# Patient Record
Sex: Male | Born: 1955 | Race: White | Hispanic: No | Marital: Married | Smoking: Former smoker
Health system: Southern US, Community
[De-identification: ages and names within clinical notes are randomized; demographics above are authoritative.]

## PROBLEM LIST (undated history)

## (undated) DIAGNOSIS — I10 Essential (primary) hypertension: Secondary | ICD-10-CM

## (undated) HISTORY — PX: UMBILICAL HERNIA REPAIR: SHX196

## (undated) HISTORY — PX: INGUINAL HERNIA REPAIR: SUR1180

---

## 2016-11-21 ENCOUNTER — Inpatient Hospital Stay (HOSPITAL_COMMUNITY)
Admission: EM | Admit: 2016-11-21 | Discharge: 2016-11-27 | DRG: 299 | Disposition: A | Discharge status: Rehabilitation Facility | Payer: BLUE CROSS/BLUE SHIELD | Attending: Cardiothoracic Surgery | Admitting: Cardiothoracic Surgery

## 2016-11-21 ENCOUNTER — Encounter (HOSPITAL_COMMUNITY): Payer: Self-pay | Admitting: Emergency Medicine

## 2016-11-21 ENCOUNTER — Inpatient Hospital Stay (HOSPITAL_COMMUNITY): Payer: BLUE CROSS/BLUE SHIELD

## 2016-11-21 ENCOUNTER — Emergency Department (HOSPITAL_COMMUNITY): Payer: BLUE CROSS/BLUE SHIELD

## 2016-11-21 DIAGNOSIS — E46 Unspecified protein-calorie malnutrition: Secondary | ICD-10-CM | POA: Diagnosis not present

## 2016-11-21 DIAGNOSIS — G8191 Hemiplegia, unspecified affecting right dominant side: Secondary | ICD-10-CM | POA: Diagnosis present

## 2016-11-21 DIAGNOSIS — I7100 Dissection of unspecified site of aorta: Secondary | ICD-10-CM | POA: Diagnosis not present

## 2016-11-21 DIAGNOSIS — I6330 Cerebral infarction due to thrombosis of unspecified cerebral artery: Secondary | ICD-10-CM | POA: Diagnosis not present

## 2016-11-21 DIAGNOSIS — Z9114 Patient's other noncompliance with medication regimen: Secondary | ICD-10-CM | POA: Diagnosis not present

## 2016-11-21 DIAGNOSIS — Z9119 Patient's noncompliance with other medical treatment and regimen: Secondary | ICD-10-CM | POA: Diagnosis not present

## 2016-11-21 DIAGNOSIS — I7101 Dissection of thoracic aorta: Secondary | ICD-10-CM | POA: Diagnosis present

## 2016-11-21 DIAGNOSIS — I6340 Cerebral infarction due to embolism of unspecified cerebral artery: Secondary | ICD-10-CM | POA: Diagnosis not present

## 2016-11-21 DIAGNOSIS — I7789 Other specified disorders of arteries and arterioles: Secondary | ICD-10-CM | POA: Diagnosis not present

## 2016-11-21 DIAGNOSIS — E785 Hyperlipidemia, unspecified: Secondary | ICD-10-CM | POA: Diagnosis present

## 2016-11-21 DIAGNOSIS — I6339 Cerebral infarction due to thrombosis of other cerebral artery: Secondary | ICD-10-CM | POA: Diagnosis not present

## 2016-11-21 DIAGNOSIS — R4702 Dysphasia: Secondary | ICD-10-CM

## 2016-11-21 DIAGNOSIS — R4781 Slurred speech: Secondary | ICD-10-CM | POA: Diagnosis present

## 2016-11-21 DIAGNOSIS — N179 Acute kidney failure, unspecified: Secondary | ICD-10-CM | POA: Diagnosis not present

## 2016-11-21 DIAGNOSIS — K219 Gastro-esophageal reflux disease without esophagitis: Secondary | ICD-10-CM | POA: Diagnosis present

## 2016-11-21 DIAGNOSIS — R29810 Facial weakness: Secondary | ICD-10-CM | POA: Diagnosis present

## 2016-11-21 DIAGNOSIS — R1310 Dysphagia, unspecified: Secondary | ICD-10-CM | POA: Diagnosis present

## 2016-11-21 DIAGNOSIS — I639 Cerebral infarction, unspecified: Secondary | ICD-10-CM | POA: Diagnosis not present

## 2016-11-21 DIAGNOSIS — I6350 Cerebral infarction due to unspecified occlusion or stenosis of unspecified cerebral artery: Secondary | ICD-10-CM | POA: Diagnosis not present

## 2016-11-21 DIAGNOSIS — Z87891 Personal history of nicotine dependence: Secondary | ICD-10-CM | POA: Diagnosis not present

## 2016-11-21 DIAGNOSIS — K229 Disease of esophagus, unspecified: Secondary | ICD-10-CM

## 2016-11-21 DIAGNOSIS — R7303 Prediabetes: Secondary | ICD-10-CM | POA: Diagnosis not present

## 2016-11-21 DIAGNOSIS — E236 Other disorders of pituitary gland: Secondary | ICD-10-CM | POA: Diagnosis present

## 2016-11-21 DIAGNOSIS — I161 Hypertensive emergency: Secondary | ICD-10-CM

## 2016-11-21 DIAGNOSIS — R079 Chest pain, unspecified: Secondary | ICD-10-CM

## 2016-11-21 DIAGNOSIS — I10 Essential (primary) hypertension: Secondary | ICD-10-CM

## 2016-11-21 DIAGNOSIS — T148XXA Other injury of unspecified body region, initial encounter: Secondary | ICD-10-CM | POA: Diagnosis not present

## 2016-11-21 DIAGNOSIS — I719 Aortic aneurysm of unspecified site, without rupture: Secondary | ICD-10-CM

## 2016-11-21 DIAGNOSIS — R1013 Epigastric pain: Secondary | ICD-10-CM | POA: Diagnosis not present

## 2016-11-21 HISTORY — DX: Essential (primary) hypertension: I10

## 2016-11-21 LAB — COMPREHENSIVE METABOLIC PANEL
ALBUMIN: 4.4 g/dL (ref 3.5–5.0)
ALT: 17 U/L (ref 17–63)
AST: 20 U/L (ref 15–41)
Alkaline Phosphatase: 70 U/L (ref 38–126)
Anion gap: 9 (ref 5–15)
BILIRUBIN TOTAL: 1 mg/dL (ref 0.3–1.2)
BUN: 10 mg/dL (ref 6–20)
CHLORIDE: 104 mmol/L (ref 101–111)
CO2: 23 mmol/L (ref 22–32)
CREATININE: 1.27 mg/dL — AB (ref 0.61–1.24)
Calcium: 8.7 mg/dL — ABNORMAL LOW (ref 8.9–10.3)
GFR calc Af Amer: >60 mL/min (ref >60)
GFR, EST NON AFRICAN AMERICAN: 60 mL/min — AB (ref >60)
GLUCOSE: 158 mg/dL — AB (ref 65–99)
POTASSIUM: 3.7 mmol/L (ref 3.5–5.1)
Sodium: 136 mmol/L (ref 135–145)
Total Protein: 7.2 g/dL (ref 6.5–8.1)

## 2016-11-21 LAB — CBC
HEMATOCRIT: 47 % (ref 39.0–52.0)
HEMOGLOBIN: 16.3 g/dL (ref 13.0–17.0)
MCH: 30 pg (ref 26.0–34.0)
MCHC: 34.7 g/dL (ref 30.0–36.0)
MCV: 86.4 fL (ref 78.0–100.0)
Platelets: 183 10*3/uL (ref 150–400)
RBC: 5.44 MIL/uL (ref 4.22–5.81)
RDW: 13 % (ref 11.5–15.5)
WBC: 9.8 10*3/uL (ref 4.0–10.5)

## 2016-11-21 LAB — URINALYSIS, COMPLETE (UACMP) WITH MICROSCOPIC
Bacteria, UA: NONE SEEN
Bilirubin Urine: NEGATIVE
Glucose, UA: NEGATIVE mg/dL
Ketones, ur: 20 mg/dL — AB
Leukocytes, UA: NEGATIVE
Nitrite: NEGATIVE
Protein, ur: NEGATIVE mg/dL
Specific Gravity, Urine: >1.046 — ABNORMAL HIGH (ref 1.005–1.030)
pH: 5 (ref 5.0–8.0)

## 2016-11-21 LAB — TROPONIN I
Troponin I: <0.03 ng/mL (ref <0.03)
Troponin I: 0.14 ng/mL (ref <0.03)
Troponin I: 0.45 ng/mL (ref <0.03)

## 2016-11-21 LAB — PROTIME-INR
INR: 0.99
Prothrombin Time: 13.1 seconds (ref 11.4–15.2)

## 2016-11-21 LAB — I-STAT TROPONIN, ED: Troponin i, poc: 0 ng/mL (ref 0.00–0.08)

## 2016-11-21 LAB — D-DIMER, QUANTITATIVE: D-Dimer, Quant: 2.06 ug/mL-FEU — ABNORMAL HIGH (ref 0.00–0.50)

## 2016-11-21 LAB — MAGNESIUM: Magnesium: 1.8 mg/dL (ref 1.7–2.4)

## 2016-11-21 LAB — MRSA PCR SCREENING: MRSA by PCR: NEGATIVE

## 2016-11-21 MED ORDER — LABETALOL HCL 5 MG/ML IV SOLN: 0.5000 mg/min | INTRAVENOUS | Status: DC

## 2016-11-21 MED ORDER — NICARDIPINE HCL IN NACL 20-0.86 MG/200ML-% IV SOLN
3.0000 mg/h | INTRAVENOUS | Status: DC
  Administered 2016-11-21: 4 mg/h via INTRAVENOUS
  Administered 2016-11-21: 5 mg/h via INTRAVENOUS
  Filled 2016-11-21: qty 200

## 2016-11-21 MED ORDER — PANTOPRAZOLE SODIUM 40 MG PO TBEC
40.0000 mg | DELAYED_RELEASE_TABLET | Freq: Every day | ORAL | Status: DC
  Administered 2016-11-21 – 2016-11-25 (×5): 40 mg via ORAL
  Filled 2016-11-21 (×5): qty 1

## 2016-11-21 MED ORDER — FENTANYL CITRATE (PF) 100 MCG/2ML IJ SOLN: 50.0000 ug | INTRAMUSCULAR | Status: DC | PRN

## 2016-11-21 MED ORDER — MORPHINE SULFATE (PF) 4 MG/ML IV SOLN
4.0000 mg | Freq: Once | INTRAVENOUS | Status: AC
  Administered 2016-11-21: 4 mg via INTRAVENOUS
  Filled 2016-11-21: qty 1

## 2016-11-21 MED ORDER — METOPROLOL TARTRATE 5 MG/5ML IV SOLN
5.0000 mg | Freq: Once | INTRAVENOUS | Status: AC
  Administered 2016-11-21: 5 mg via INTRAVENOUS
  Filled 2016-11-21: qty 5

## 2016-11-21 MED ORDER — METOPROLOL TARTRATE 25 MG PO TABS
25.0000 mg | ORAL_TABLET | Freq: Two times a day (BID) | ORAL | Status: DC
  Administered 2016-11-22 – 2016-11-23 (×4): 25 mg via ORAL
  Filled 2016-11-21 (×4): qty 1

## 2016-11-21 MED ORDER — DEXTROSE-NACL 5-0.45 % IV SOLN
INTRAVENOUS | Status: DC
  Administered 2016-11-21: 14:00:00 via INTRAVENOUS

## 2016-11-21 MED ORDER — NICARDIPINE HCL IN NACL 20-0.86 MG/200ML-% IV SOLN
3.0000 mg/h | Freq: Once | INTRAVENOUS | Status: AC
  Administered 2016-11-21: 5 mg/h via INTRAVENOUS
  Filled 2016-11-21: qty 200

## 2016-11-21 MED ORDER — ACETAMINOPHEN 650 MG RE SUPP: 650.0000 mg | Freq: Four times a day (QID) | RECTAL | Status: DC | PRN

## 2016-11-21 MED ORDER — IOPAMIDOL (ISOVUE-370) INJECTION 76%
INTRAVENOUS | Status: AC
  Administered 2016-11-21: 100 mL
  Filled 2016-11-21: qty 100

## 2016-11-21 MED ORDER — MORPHINE SULFATE (PF) 4 MG/ML IV SOLN
8.0000 mg | Freq: Once | INTRAVENOUS | Status: AC
  Administered 2016-11-21: 8 mg via INTRAVENOUS
  Filled 2016-11-21: qty 2

## 2016-11-21 MED ORDER — MAGNESIUM HYDROXIDE 400 MG/5ML PO SUSP: 30.0000 mL | Freq: Every day | ORAL | Status: DC | PRN

## 2016-11-21 MED ORDER — GI COCKTAIL ~~LOC~~
30.0000 mL | Freq: Once | ORAL | Status: DC
  Filled 2016-11-21 (×2): qty 30

## 2016-11-21 MED ORDER — TRAMADOL HCL 50 MG PO TABS: 50.0000 mg | ORAL_TABLET | Freq: Four times a day (QID) | ORAL | Status: DC | PRN

## 2016-11-21 MED ORDER — LOSARTAN POTASSIUM 50 MG PO TABS
50.0000 mg | ORAL_TABLET | Freq: Every day | ORAL | Status: DC
  Administered 2016-11-22: 50 mg via ORAL
  Filled 2016-11-21: qty 1

## 2016-11-21 MED ORDER — FAMOTIDINE IN NACL 20-0.9 MG/50ML-% IV SOLN
20.0000 mg | Freq: Two times a day (BID) | INTRAVENOUS | Status: DC
  Administered 2016-11-21 – 2016-11-24 (×8): 20 mg via INTRAVENOUS
  Filled 2016-11-21 (×9): qty 50

## 2016-11-21 MED ORDER — ONDANSETRON HCL 4 MG/2ML IJ SOLN
4.0000 mg | Freq: Once | INTRAMUSCULAR | Status: AC
  Administered 2016-11-21: 4 mg via INTRAVENOUS
  Filled 2016-11-21: qty 2

## 2016-11-21 MED ORDER — ACETAMINOPHEN 325 MG PO TABS
650.0000 mg | ORAL_TABLET | Freq: Four times a day (QID) | ORAL | Status: DC | PRN
  Administered 2016-11-23: 650 mg via ORAL
  Filled 2016-11-21: qty 2

## 2016-11-21 MED ORDER — SORBITOL 70 % SOLN: 30.0000 mL | Freq: Every day | Status: DC | PRN

## 2016-11-21 MED ORDER — LABETALOL HCL 5 MG/ML IV SOLN
0.5000 mg/min | INTRAVENOUS | Status: DC
  Administered 2016-11-21: 0.5 mg/min via INTRAVENOUS
  Administered 2016-11-22: 1 mg/min via INTRAVENOUS
  Administered 2016-11-22: 2 mg/min via INTRAVENOUS
  Filled 2016-11-21 (×5): qty 100

## 2016-11-21 MED ORDER — ONDANSETRON HCL 4 MG PO TABS: 4.0000 mg | ORAL_TABLET | Freq: Four times a day (QID) | ORAL | Status: DC | PRN

## 2016-11-21 MED ORDER — ONDANSETRON HCL 4 MG/2ML IJ SOLN: 4.0000 mg | Freq: Four times a day (QID) | INTRAMUSCULAR | Status: DC | PRN

## 2016-11-21 MED ORDER — METOPROLOL TARTRATE 25 MG PO TABS: 25.0000 mg | ORAL_TABLET | Freq: Two times a day (BID) | ORAL | Status: DC

## 2016-11-21 MED ORDER — ADULT MULTIVITAMIN W/MINERALS CH
1.0000 | ORAL_TABLET | Freq: Every day | ORAL | Status: DC
  Administered 2016-11-21 – 2016-11-27 (×7): 1 via ORAL
  Filled 2016-11-21 (×8): qty 1

## 2016-11-21 MED ORDER — SIMETHICONE 80 MG PO CHEW
80.0000 mg | CHEWABLE_TABLET | Freq: Four times a day (QID) | ORAL | Status: DC
  Administered 2016-11-21 – 2016-11-27 (×23): 80 mg via ORAL
  Filled 2016-11-21 (×23): qty 1

## 2016-11-21 MED ORDER — HYDRALAZINE HCL 20 MG/ML IJ SOLN
20.0000 mg | INTRAMUSCULAR | Status: AC
  Administered 2016-11-21: 20 mg via INTRAVENOUS
  Filled 2016-11-21: qty 1

## 2016-11-21 NOTE — Progress Notes (Signed)
2305 pt called said experiencing rt arm weakness stoke assessment done slight rt facial droop noted along with rt arm drift rapid response paged 2316 rapid response arrived 2343 stat head ct finished neurologist consulted

## 2016-11-21 NOTE — ED Notes (Signed)
CT surgery at bedside.

## 2016-11-21 NOTE — ED Notes (Signed)
Paged admitting about BP medication clarification.

## 2016-11-21 NOTE — Progress Notes (Signed)
CT surgery p.m. 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

## 2016-11-21 NOTE — ED Provider Notes (Signed)
[facility]-EMERGENCY DEPT Provider Note   CSN: [id number] Arrival date & time: 11/21/16  4098     History   Chief Complaint No chief complaint on file.   HPI Jordan Mcintosh is a 61 y.o. male.  HPI Patient presents with concern of back pain, upper abdominal pain. Patient awoke this morning with pain in his mid back, which then began to radiate to the upper mid abdomen. Since onset symptoms of persistent, though improved after paramedics arrived, and he took aspirin, nitroglycerin. He also complains of general discomfort, mild lightheadedness, but no syncope, no dyspnea, no weakness in any extremity, no nausea, vomiting. EMS notes that on arrival the patient was diaphoretic, weak appearing, with a blood pressure of 280 systolic. En route, the patient had subjective improvement, and blood pressure decreased to 260 systolic. Patient notes that he stopped taking blood pressure medication about 9 months ago, prior to that the patient had blood pressure readings typically in the 200 systolic range.   No past medical history on file.  There are no active problems to display for this patient.   No past surgical history on file.     Home Medications    Prior to Admission medications   Not on File    Family History No family history on file.  Social History Social History  Substance Use Topics  . Smoking status: Not on file  . Smokeless tobacco: Not on file  . Alcohol use Not on file     Allergies   Patient has no allergy information on record.   Review of Systems Review of Systems  Constitutional:       Per HPI, otherwise negative  HENT:       Per HPI, otherwise negative  Respiratory:       Per HPI, otherwise negative  Cardiovascular:       Per HPI, otherwise negative  Gastrointestinal: Negative for vomiting.  Endocrine:       Negative aside from HPI  Genitourinary:       Neg aside from HPI   Musculoskeletal:       Per HPI, otherwise negative  Skin: Negative.    Neurological: Negative for syncope.     Physical Exam Updated Vital Signs SpO2 98%   Physical Exam  Constitutional: He is oriented to person, place, and time. He appears well-developed.  Diaphoretic male, awake, alert ariving via EMS  HENT:  Head: Normocephalic and atraumatic.  Eyes: Conjunctivae and EOM are normal.  Cardiovascular: Normal rate and regular rhythm.   Pulmonary/Chest: Effort normal. No stridor. No respiratory distress.  Abdominal: He exhibits no distension.  TTP just superior to the umbilicus, w no guarding, rebound.  Musculoskeletal: He exhibits no edema.  Neurological: He is alert and oriented to person, place, and time. He displays no atrophy and no tremor. No cranial nerve deficit. He exhibits normal muscle tone. He displays no seizure activity.  Skin: Skin is warm and dry.  Psychiatric: He has a normal mood and affect.  Nursing note and vitals reviewed.  Initial blood pressure systolic 260+, not registering on the monitor. Map 180. Patient will receive hydralazine, as he is receiving initial therapy. Patient also complains of severe pain, will receive morphine. Cardiac monitors applied, labs drawn.   9:43 AM BP following hydralazine 220/115  ED Treatments / Results  Labs (all labs ordered are listed, but only abnormal results are displayed) Labs Reviewed  CBC  D-DIMER, QUANTITATIVE (NOT AT [facility])  COMPREHENSIVE METABOLIC PANEL  TROPONIN I  Rosezena Sensor, ED   EMS rhythm strip, rate 94, T-wave abnormalities, some hypertrophic changes, abnormal  EKG  EKG Interpretation  Date/Time:  Sunday November 21 2016 09:25:15 EDT Ventricular Rate:  92 PR Interval:    QRS Duration: 86 QT Interval:  387 QTC Calculation: 479 R Axis:   45 Text Interpretation:  Sinus rhythm Artifact T wave abnormality Non-specific intra-ventricular conduction delay Abnormal ekg Confirmed by Gerhard Munch  MD [phone]) on 11/21/2016 9:28:59 AM         Radiology Dg Chest  Port 1 View  Result Date: 11/21/2016 CLINICAL DATA:  Patient from home via [facility]. Patient reports back pain that started this morning and radiates to his epigastrium. Per [facility] patient blood pressure 260/140 on arrival and patient was diaphoretic Patient reports not taking blood pressure medications in nine months. Hx of hypertension EXAM: PORTABLE CHEST 1 VIEW COMPARISON:  None. FINDINGS: Cardiac silhouette is normal in size. No mediastinal or hilar masses. No evidence of adenopathy. Clear lungs.  No pleural effusion or pneumothorax. Skeletal structures are intact. IMPRESSION: No active disease. Electronically Signed   By: Amie Portland M.D.   On: 11/21/2016 09:44   Ct Angio Chest/abd/pel For Dissection W And/or Wo Contrast  Result Date: 11/21/2016 CLINICAL DATA:  Patient reports back pain that started this morning and radiates to his epigastrium. Per [facility] patient blood pressure 260/140 on arrival and patient was diaphoretic Patient reports not taking blood pressure medications in nine months. Wife gave aspirinHx of hernia repairNo hx of CA EXAM: CT ANGIOGRAPHY CHEST, ABDOMEN AND PELVIS TECHNIQUE: Multidetector CT imaging through the chest, abdomen and pelvis was performed using the standard protocol during bolus administration of intravenous contrast. Multiplanar reconstructed images and MIPs were obtained and reviewed to evaluate the vascular anatomy. CONTRAST:  100 mL of Isovue 370 intravenous contrast COMPARISON:  Current chest radiograph FINDINGS: CTA CHEST FINDINGS Cardiovascular: The thoracic aorta is normal in caliber. At the junction of the distal arch and descending thoracic aorta, there is a penetrating ulcer associated with an intramural hematoma. The hematoma extends from the distal aortic arch through the descending thoracic aorta to the level of the aortic hiatus. There is no dissection. There is mild atherosclerosis at the origin of the arch branch vessels with no  significant stenosis. The heart is normal in size. There are moderate coronary artery calcifications. Pulmonary arteries are normal in caliber. Mediastinum/Nodes: No neck base, axillary, mediastinal or hilar masses or adenopathy. Trachea is widely patent. There is a small sliding hiatal hernia. Esophagus is unremarkable. Lungs/Pleura: Minor dependent subsegmental atelectasis. No pneumonia or pulmonary edema. No mass or suspicious nodule. No pleural effusion or pneumothorax. Musculoskeletal: No fracture or acute finding. No osteoblastic or osteolytic lesions. Review of the MIP images confirms the above findings. CTA ABDOMEN AND PELVIS FINDINGS VASCULAR Aorta: Aorta is normal in caliber. Atherosclerotic plaque is noted throughout the aorta with no significant stenosis. No dissection. Celiac: Atherosclerotic plaque at the origin of the celiac artery causes a 70% narrowing. SMA: Eccentric plaque at the origin of the superior mesenteric artery causes a 50% narrowing. Renals: Mild plaque at the origin of single renal arteries bilaterally is noted without significant stenosis. IMA: Plaque at the origin without significant stenosis. Inflow: Mild atherosclerosis of the common iliac arteries without stenosis. Atherosclerotic change involving both common femoral arteries without significant stenosis. Veins: No obvious venous abnormality within the limitations of this arterial phase study. Review of the MIP images confirms the above findings. NON-VASCULAR Hepatobiliary:  The liver calcifications consistent healed granuloma. Liver otherwise unremarkable. Normal gallbladder. No bile duct dilation. Pancreas: Unremarkable. No pancreatic ductal dilatation or surrounding inflammatory changes. Spleen: Splenic calcifications consistent healed granuloma. Spleen otherwise unremarkable. Adrenals/Urinary Tract: Low-density, 14 mm right adrenal mass, consistent with an adenoma. Normal left adrenal gland. Small low-density lesions in the  right kidney, likely cysts. Small nonobstructing stone in the lower pole the right kidney. No left renal masses or cysts. Mild bilateral renal cortical thinning. No hydronephrosis. Normal ureters. Bladder is unremarkable. Stomach/Bowel: Stomach unremarkable other than the small hiatal hernia. Normal small bowel. No colonic dilation, wall thickening or inflammation. Appendix not visualized. Lymphatic: No enlarged lymph nodes. Reproductive: Unremarkable. Other: No abdominal wall hernia or abnormality. No abdominopelvic ascites. 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 No fracture or acute finding. No osteoblastic or osteolytic lesions. Review of the MIP images confirms the above findings. IMPRESSION: 1. There is a penetrating ulcer at the junction of the aortic arch and descending thoracic aorta associated with an intramural hematoma, with the hematoma extending from the distal arch to just above the aortic hiatus. This is presumably the source of this patient's pain. 2. No aortic dissection. No aneurysm. No other acute abnormality within the chest, abdomen or pelvis. 3. Aortic and branch vessel atherosclerosis. 70% narrowing at the origin of the celiac artery. 50% narrowing at the origin of the superior mesenteric artery. 4. Small hiatal hernia. 5. Low-density right adrenal mass most likely an adenoma. 6. Changes of healed granulomatous disease. 7. Chronic avascular necrosis of both femoral heads. Electronically Signed   By: Amie Portland M.D.   On: 11/21/2016 11:27    Procedures Procedures (including critical care time)  Medications Ordered in ED Medications  hydrALAZINE (APRESOLINE) injection 20 mg (not administered)   11:53 AM Patient is now on a Cardene drip. His blood pressure has diminished appropriate, and is now 155/74.  Pain has resolved. I have reviewed the bradycardia  Images myself, agree with the interpretation. Subsequently I discussed with our cardiothoracic  surgery team given concern for ulceration, intramural hematoma. Given the patient's hypertensive emergency, new aortic intramural hematoma, he will require admission for further evaluation and management.   Initial Impression / Assessment and Plan / ED Course  I have reviewed the triage vital signs and the nursing notes.  Pertinent labs & imaging results that were available during my care of the patient were reviewed by me and considered in my medical decision making (see chart for details).    Final Clinical Impressions(s) / ED Diagnoses   Final diagnoses:  Ulcer of aorta [facility])  Hypertensive emergency   CRITICAL CARE Performed by: Gerhard Munch Total critical care time: 40 minutes Critical care time was exclusive of separately billable procedures and treating other patients. Critical care was necessary to treat or prevent imminent or life-threatening deterioration. Critical care was time spent personally by me on the following activities: development of treatment plan with patient and/or surrogate as well as nursing, discussions with consultants, evaluation of patient's response to treatment, examination of patient, obtaining history from patient or surrogate, ordering and performing treatments and interventions, ordering and review of laboratory studies, ordering and review of radiographic studies, pulse oximetry and re-evaluation of patient's condition.    Gerhard Munch, MD 11/21/16 1155

## 2016-11-21 NOTE — Progress Notes (Signed)
Troponin 0.14.  Dr. Donata Clay notified. [place], Mitzi Hansen

## 2016-11-21 NOTE — Consult Note (Signed)
[address]       [place] [address]             [phone]        [facility] Medical Record #[id number] Date of Birth: 1956-05-07  Referring:Dr Jeraldine Loots Primary Care: Feliciana Rossetti, MD  Chief Complaint:    Chief Complaint  Patient presents with  . Abdominal Pain  . 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, no dissection  61 yo WM with htn stopped taking his meds months ago Presented to ED with 4 hrs of epigastric pain, mid back pain, sweats, nausea  No focal weakness BP was 280 mm hg CTA shows normal size of thoracic aorta [3.5 cm] with small penetating ulcer- contained leak with peri-aortic hematoma Placed on cardene drip and given MSO4 with resolution of symptoms Will be admitted to ICU for BP, pain control and observation   Past surgical hx- groin hernia repair, umbilical hernia repair Stopped smoking > 10 yrs ago  Current Activity/ Functional Status: Lives with wife, works in Dentist   Zubrod Score: At the time of surgery this patient's most appropriate activity status/level should be described as:     0    Normal activity, no symptoms     1    Restricted in physical strenuous activity but ambulatory, able to do out light work     2    Ambulatory and capable of self care, unable to do work activities, up and about                 more than 50%  Of the time                                3    Only limited self care, in bed greater than 50% of waking hours     4    Completely disabled, no self care, confined to bed or chair     5    Moribund  Past Medical History:  Diagnosis Date  . Hypertension     History reviewed. No pertinent surgical history.  History  Smoking Status  . Former Smoker  Smokeless Tobacco  . Never Used    Comment: Smoked marijauna in the past but quit 9 years ago     History  Alcohol Use No    Social History   Social History  . Marital status: Married    Spouse name: N/A  . Number of children: N/A  . Years of education: N/A   Occupational History  . Not on file.   Social History Main Topics  . Smoking status: Former Games developer  . Smokeless tobacco: Never Used     Comment: Smoked marijauna in the past but quit 9 years ago    . Alcohol use No  . Drug use: Yes    Types: Marijuana     Comment: Smoked marijauna in the past but quit 9 years ago  . Sexual activity: Yes   Other Topics Concern  . Not on file   Social History Narrative  . No narrative on file    Not on File  Current Facility-Administered Medications  Medication Dose Route Frequency Provider Last Rate Last Dose  . acetaminophen (  TYLENOL) tablet 650 mg  650 mg Oral Q6H PRN Kerin Perna, MD       Or  . acetaminophen (TYLENOL) suppository 650 mg  650 mg Rectal Q6H PRN Kerin Perna, MD      . dextrose 5 %-0.45 % sodium chloride infusion   Intravenous Continuous Kerin Perna, MD      . famotidine (PEPCID) IVPB 20 mg premix  20 mg Intravenous Q12H Kerin Perna, MD      . fentaNYL (SUBLIMAZE) injection 50 mcg  50 mcg Intravenous Q1H PRN Kerin Perna, MD      . gi cocktail (Maalox,Lidocaine,Donnatal)  30 mL Oral Once Gerhard Munch, MD   Stopped at 11/21/16 1200  . labetalol (NORMODYNE,TRANDATE) 500 mg in dextrose 5 % 125 mL (4 mg/mL) infusion  0.5-3 mg/min Intravenous Titrated Kerin Perna, MD      . Melene Muller ON 11/22/2016] losartan (COZAAR) tablet 50 mg  50 mg Oral Daily Kerin Perna, MD      . magnesium hydroxide (MILK OF MAGNESIA) suspension 30 mL  30 mL Oral Daily PRN Kerin Perna, MD      . Melene Muller ON 11/22/2016] metoprolol tartrate (LOPRESSOR) tablet 25 mg  25 mg Oral BID Kerin Perna, MD      . multivitamin with minerals tablet 1 tablet  1 tablet Oral Daily Kerin Perna, MD      .  nicardipine (CARDENE)  in 0.86% saline IV infusion (0.1 mg/ml)  3-15 mg/hr Intravenous Continuous Kerin Perna, MD 50 mL/hr at 11/21/16 1343 5 mg/hr at 11/21/16 1343  . ondansetron (ZOFRAN) tablet 4 mg  4 mg Oral Q6H PRN Kerin Perna, MD       Or  . ondansetron [facility]) injection 4 mg  4 mg Intravenous Q6H PRN Kerin Perna, MD      . pantoprazole (PROTONIX) EC tablet 40 mg  40 mg Oral Q1200 Kerin Perna, MD      . sorbitol 70 % solution 30 mL  30 mL Oral Daily PRN Kerin Perna, MD      . traMADol Janean Sark) tablet 50 mg  50 mg Oral Q6H PRN Kerin Perna, MD       No current outpatient prescriptions on file.     (Not in a hospital admission)  History reviewed. No pertinent family history.   Review of Systems:       Cardiac Review of Systems: Y or N  Chest Pain [  y  ]  Resting SOB [ n  ] Exertional SOB  Cove.Etienne  ]  Orthopnea [ n ]   Pedal Edema [n   ]    Palpitations [ y ] Syncope  [n  ]   Presyncope Cove.Etienne   ]  General Review of Systems: [Y] = yes [  ]=no Constitional: recent weight change [  ]; anorexia [  ]; fatigue [  ]; nausea [  ]; night sweats [  ]; fever [  ]; or chills [  ]                                                               Dental: poor dentition[  ]; Last Dentist visit:edentulous   Eye : blurred  vision [  ]; diplopia [   ]; vision changes [  ];  Amaurosis fugax[  ]; Resp: cough [  ];  wheezing[  ];  hemoptysis[  ]; shortness of breath[  ]; paroxysmal nocturnal dyspnea[  ]; dyspnea on exertion[  ]; or orthopnea[  ];  GI:  gallstones[  ], vomiting[y  ];  dysphagia[ y ]; melena[  ];  hematochezia [  ]; heartburn[ y ];   Hx of  Colonoscopy[  ]; GU: kidney stones [  y]; hematuria[  ];   dysuria [  ];  nocturia[  ];  history of     obstruction [  ]; urinary frequency [  ]             Skin: rash, swelling[  ];, hair loss[  ];  peripheral edema[  ];  or itching[  ]; Musculosketetal: myalgias[  ];  joint swelling[  ];  joint erythema[  ];  joint pain[  ];   back pain[ y ];  Heme/Lymph: bruising[  ];  bleeding[  ];  anemia[  ];  Neuro: TIA[  ];  headaches[  ];  stroke[  ];  vertigo[  ];  seizures[  ];   paresthesias[  ];  difficulty walking[  ];  Psych:depression[  ]; anxiety[  ];  Endocrine: diabetes[  ];  thyroid dysfunction[  ];  Immunizations: Flu [  ]; Pneumococcal[  ];  Other: R hand dominant  Physical Exam: BP (!) 160/84   Pulse 94   Temp 97.6 F (36.4 C) (Oral)   Resp 19   Ht  (1.803 m)   Wt 200 lb (90.7 kg)   SpO2 94%   BMI 27.89 kg/m       Physical Exam  General: Well-nourished middle-aged Caucasian male anxious but in no acute distress HEENT: Normocephalic pupils equal , dentition adequate Neck: Supple without JVD, adenopathy, or bruit Chest: Clear to auscultation, symmetrical breath sounds, no rhonchi, no tenderness             or deformity Cardiovascular: Regular rate and rhythm, sinus tachycardia, no murmur, no gallop, peripheral pulses             palpable in all extremities Abdomen:  Soft, nontender, no palpable mass or organomegaly Extremities: Warm, well-perfused, no clubbing cyanosis edema or tenderness,              no venous stasis changes of the legs Rectal/GU: Deferred Neuro: Grossly non--focal and symmetrical throughout Skin: Clean and dry without rash or ulceration    Diagnostic Studies & Laboratory data:     Recent Radiology Findings:   Dg Chest Port 1 View  Result Date: 11/21/2016 CLINICAL DATA:  Patient from home via [facility]. Patient reports back pain that started this morning and radiates to his epigastrium. Per [facility] patient blood pressure 260/140 on arrival and patient was diaphoretic Patient reports not taking blood pressure medications in nine months. Hx of hypertension EXAM: PORTABLE CHEST 1 VIEW COMPARISON:  None. FINDINGS: Cardiac silhouette is normal in size. No mediastinal or hilar masses. No evidence of adenopathy. Clear lungs.  No pleural effusion or pneumothorax.  Skeletal structures are intact. IMPRESSION: No active disease. Electronically Signed   By: Amie Portland M.D.   On: 11/21/2016 09:44   Ct Angio Chest/abd/pel For Dissection W And/or Wo Contrast  Result Date: 11/21/2016 CLINICAL DATA:  Patient reports back pain that started this morning and radiates to his epigastrium. Per [facility] patient  blood pressure 260/140 on arrival and patient was diaphoretic Patient reports not taking blood pressure medications in nine months. Wife gave aspirinHx of hernia repairNo hx of CA EXAM: CT ANGIOGRAPHY CHEST, ABDOMEN AND PELVIS TECHNIQUE: Multidetector CT imaging through the chest, abdomen and pelvis was performed using the standard protocol during bolus administration of intravenous contrast. Multiplanar reconstructed images and MIPs were obtained and reviewed to evaluate the vascular anatomy. CONTRAST:  100 mL of Isovue 370 intravenous contrast COMPARISON:  Current chest radiograph FINDINGS: CTA CHEST FINDINGS Cardiovascular: The thoracic aorta is normal in caliber. At the junction of the distal arch and descending thoracic aorta, there is a penetrating ulcer associated with an intramural hematoma. The hematoma extends from the distal aortic arch through the descending thoracic aorta to the level of the aortic hiatus. There is no dissection. There is mild atherosclerosis at the origin of the arch branch vessels with no significant stenosis. The heart is normal in size. There are moderate coronary artery calcifications. Pulmonary arteries are normal in caliber. Mediastinum/Nodes: No neck base, axillary, mediastinal or hilar masses or adenopathy. Trachea is widely patent. There is a small sliding hiatal hernia. Esophagus is unremarkable. Lungs/Pleura: Minor dependent subsegmental atelectasis. No pneumonia or pulmonary edema. No mass or suspicious nodule. No pleural effusion or pneumothorax. Musculoskeletal: No fracture or acute finding. No osteoblastic or osteolytic lesions.  Review of the MIP images confirms the above findings. CTA ABDOMEN AND PELVIS FINDINGS VASCULAR Aorta: Aorta is normal in caliber. Atherosclerotic plaque is noted throughout the aorta with no significant stenosis. No dissection. Celiac: Atherosclerotic plaque at the origin of the celiac artery causes a 70% narrowing. SMA: Eccentric plaque at the origin of the superior mesenteric artery causes a 50% narrowing. Renals: Mild plaque at the origin of single renal arteries bilaterally is noted without significant stenosis. IMA: Plaque at the origin without significant stenosis. Inflow: Mild atherosclerosis of the common iliac arteries without stenosis. Atherosclerotic change involving both common femoral arteries without significant stenosis. Veins: No obvious venous abnormality within the limitations of this arterial phase study. Review of the MIP images confirms the above findings. NON-VASCULAR Hepatobiliary: The liver calcifications consistent healed granuloma. Liver otherwise unremarkable. Normal gallbladder. No bile duct dilation. Pancreas: Unremarkable. No pancreatic ductal dilatation or surrounding inflammatory changes. Spleen: Splenic calcifications consistent healed granuloma. Spleen otherwise unremarkable. Adrenals/Urinary Tract: Low-density, 14 mm right adrenal mass, consistent with an adenoma. Normal left adrenal gland. Small low-density lesions in the right kidney, likely cysts. Small nonobstructing stone in the lower pole the right kidney. No left renal masses or cysts. Mild bilateral renal cortical thinning. No hydronephrosis. Normal ureters. Bladder is unremarkable. Stomach/Bowel: Stomach unremarkable other than the small hiatal hernia. Normal small bowel. No colonic dilation, wall thickening or inflammation. Appendix not visualized. Lymphatic: No enlarged lymph nodes. Reproductive: Unremarkable. Other: No abdominal wall hernia or abnormality. No abdominopelvic ascites. Musculoskeletal: Chronic avascular  necrosis of the femoral heads. No fracture or acute finding. No osteoblastic or osteolytic lesions. Review of the MIP images confirms the above findings. IMPRESSION: 1. There is a penetrating ulcer at the junction of the aortic arch and descending thoracic aorta associated with an intramural hematoma, with the hematoma extending from the distal arch to just above the aortic hiatus. This is presumably the source of this patient's pain. 2. No aortic dissection. No aneurysm. No other acute abnormality within the chest, abdomen or pelvis. 3. Aortic and branch vessel atherosclerosis. 70% narrowing at the origin of the celiac artery. 50% narrowing at the origin  of the superior mesenteric artery. 4. Small hiatal hernia. 5. Low-density right adrenal mass most likely an adenoma. 6. Changes of healed granulomatous disease. 7. Chronic avascular necrosis of both femoral heads. Electronically Signed   By: Amie Portland M.D.   On: 11/21/2016 11:27     I have independently reviewed the above radiologic studies.  Recent Lab Findings: Lab Results  Component Value Date   WBC 9.8 11/21/2016   HGB 16.3 11/21/2016   HCT 47.0 11/21/2016   PLT 183 11/21/2016   GLUCOSE 158 (H) 11/21/2016   ALT 17 11/21/2016   AST 20 11/21/2016   NA 136 11/21/2016   K 3.7 11/21/2016   CL 104 11/21/2016   CREATININE 1.27 (H) 11/21/2016   BUN 10 11/21/2016   CO2 23 11/21/2016      Assessment / Plan:      Chest pain, epigastric pain secondary to a penetrating ulcer of the descending thoracic aorta with mediastinal hematoma, no pleural effusion or pericardial effusion  Uncontrolled hypertension after stopping meds 6 months ago  Dysphagia with difficulty swallowing food at the level of his neck   Extensive history of GERD  History of marijuana abuse in the past, 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 He'll be placed at bedrest for 24-48 hours We will obtain serial cardiac enzymes and obtain thoracic echocardiogram  Later the patient will need assessment of his esophagus with a barium swallow in head CT scan to assess symptoms of possible early dementia     @ME1 @ 11/21/2016 1:46 PM

## 2016-11-21 NOTE — ED Notes (Signed)
Patient complaining of nausea. EDP aware.

## 2016-11-21 NOTE — ED Triage Notes (Addendum)
Patient from home via [facility]. Patient reports back pain that started this morning and radiates to his epigastrium. Per [facility] patient blood pressure 260/140 on arrival and patient was diaphoretic Patient reports not taking blood pressure medications in nine months. Wife gave patient 324 mg aspirin prior to EMS arrival. EMS administered 150 mcg on fentanyl in route to facility. No nero deficits with EMS. Patient reports having intercourse when to pain began, pt denies any errectile drugs.

## 2016-11-22 ENCOUNTER — Inpatient Hospital Stay (HOSPITAL_COMMUNITY): Payer: BLUE CROSS/BLUE SHIELD

## 2016-11-22 ENCOUNTER — Encounter (HOSPITAL_COMMUNITY): Payer: Self-pay | Admitting: Nurse Practitioner

## 2016-11-22 DIAGNOSIS — E7800 Pure hypercholesterolemia, unspecified: Secondary | ICD-10-CM

## 2016-11-22 DIAGNOSIS — I7100 Dissection of unspecified site of aorta: Secondary | ICD-10-CM

## 2016-11-22 DIAGNOSIS — I6330 Cerebral infarction due to thrombosis of unspecified cerebral artery: Secondary | ICD-10-CM

## 2016-11-22 DIAGNOSIS — I6339 Cerebral infarction due to thrombosis of other cerebral artery: Secondary | ICD-10-CM

## 2016-11-22 DIAGNOSIS — I7101 Dissection of thoracic aorta: Secondary | ICD-10-CM

## 2016-11-22 DIAGNOSIS — I161 Hypertensive emergency: Secondary | ICD-10-CM

## 2016-11-22 DIAGNOSIS — I7789 Other specified disorders of arteries and arterioles: Secondary | ICD-10-CM

## 2016-11-22 LAB — CBC
HCT: 40.9 % (ref 39.0–52.0)
Hemoglobin: 14 g/dL (ref 13.0–17.0)
MCH: 29.7 pg (ref 26.0–34.0)
MCHC: 34.2 g/dL (ref 30.0–36.0)
MCV: 86.8 fL (ref 78.0–100.0)
Platelets: 174 10*3/uL (ref 150–400)
RBC: 4.71 MIL/uL (ref 4.22–5.81)
RDW: 13.5 % (ref 11.5–15.5)
WBC: 12.9 10*3/uL — ABNORMAL HIGH (ref 4.0–10.5)

## 2016-11-22 LAB — LIPID PANEL
CHOLESTEROL: 164 mg/dL (ref 0–200)
HDL: 34 mg/dL — ABNORMAL LOW (ref >40)
LDL CALC: 107 mg/dL — AB (ref 0–99)
TRIGLYCERIDES: 117 mg/dL (ref <150)
Total CHOL/HDL Ratio: 4.8 RATIO
VLDL: 23 mg/dL (ref 0–40)

## 2016-11-22 LAB — TROPONIN I: Troponin I: 0.84 ng/mL (ref <0.03)

## 2016-11-22 LAB — GLUCOSE, CAPILLARY: Glucose-Capillary: 100 mg/dL — ABNORMAL HIGH (ref 65–99)

## 2016-11-22 LAB — RAPID URINE DRUG SCREEN, HOSP PERFORMED
AMPHETAMINES: NOT DETECTED
Barbiturates: NOT DETECTED
Benzodiazepines: NOT DETECTED
Cocaine: NOT DETECTED
OPIATES: POSITIVE — AB
Tetrahydrocannabinol: NOT DETECTED

## 2016-11-22 LAB — COMPREHENSIVE METABOLIC PANEL
ALT: 15 U/L — ABNORMAL LOW (ref 17–63)
AST: 22 U/L (ref 15–41)
Albumin: 3.5 g/dL (ref 3.5–5.0)
Alkaline Phosphatase: 60 U/L (ref 38–126)
Anion gap: 10 (ref 5–15)
BUN: 12 mg/dL (ref 6–20)
CO2: 23 mmol/L (ref 22–32)
Calcium: 8.7 mg/dL — ABNORMAL LOW (ref 8.9–10.3)
Chloride: 103 mmol/L (ref 101–111)
Creatinine, Ser: 1.05 mg/dL (ref 0.61–1.24)
GFR calc Af Amer: >60 mL/min (ref >60)
GFR calc non Af Amer: >60 mL/min (ref >60)
Glucose, Bld: 105 mg/dL — ABNORMAL HIGH (ref 65–99)
Potassium: 3.4 mmol/L — ABNORMAL LOW (ref 3.5–5.1)
Sodium: 136 mmol/L (ref 135–145)
Total Bilirubin: 1.1 mg/dL (ref 0.3–1.2)
Total Protein: 6 g/dL — ABNORMAL LOW (ref 6.5–8.1)

## 2016-11-22 LAB — ECHOCARDIOGRAM COMPLETE
Height: 71 in
Weight: 3200 oz

## 2016-11-22 LAB — HIV ANTIBODY (ROUTINE TESTING W REFLEX): HIV Screen 4th Generation wRfx: NONREACTIVE

## 2016-11-22 LAB — HEMOGLOBIN A1C
Hgb A1c MFr Bld: 5.9 % — ABNORMAL HIGH (ref 4.8–5.6)
Mean Plasma Glucose: 123 mg/dL

## 2016-11-22 LAB — VITAMIN B12: VITAMIN B 12: 281 pg/mL (ref 180–914)

## 2016-11-22 LAB — TSH: TSH: 0.634 u[IU]/mL (ref 0.350–4.500)

## 2016-11-22 MED ORDER — IOPAMIDOL (ISOVUE-370) INJECTION 76%
INTRAVENOUS | Status: AC
  Administered 2016-11-22: 50 mL
  Filled 2016-11-22: qty 50

## 2016-11-22 MED ORDER — GADOBENATE DIMEGLUMINE 529 MG/ML IV SOLN
20.0000 mL | Freq: Once | INTRAVENOUS | Status: AC | PRN
  Administered 2016-11-22: 20 mL via INTRAVENOUS

## 2016-11-22 MED ORDER — ATORVASTATIN CALCIUM 40 MG PO TABS
40.0000 mg | ORAL_TABLET | Freq: Every day | ORAL | Status: DC
  Administered 2016-11-22 – 2016-11-27 (×6): 40 mg via ORAL
  Filled 2016-11-22 (×6): qty 1

## 2016-11-22 MED ORDER — SODIUM CHLORIDE 0.9 % IV SOLN
30.0000 meq | Freq: Once | INTRAVENOUS | Status: AC
  Administered 2016-11-22: 30 meq via INTRAVENOUS
  Filled 2016-11-22: qty 15

## 2016-11-22 MED ORDER — SODIUM CHLORIDE 0.9 % IV SOLN
INTRAVENOUS | Status: DC
  Administered 2016-11-24: 18:00:00 via INTRAVENOUS

## 2016-11-22 NOTE — Significant Event (Signed)
Rapid Response Event Note  Overview: Time Called: 2307 Arrival Time: 2310 Event Type: Neurologic  Initial Focused Assessment: Called by RN to assess patient for stroke like symptoms.  Per RN, patient was experiencing right arm weakness and patient had right sided facial droop.  Upon assessment, patient was alert, oriented, follow commands, speech is slurred, and right sided weakness is present in RUE and RLE.  NIH was done, NIH of 8 (2- RUE Motor, 2- RLE Motor, 1- Limb Ataxia, 1-Facial Palsy, 1- Dysarthria, 1-Sensory loss 1 = 8).    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    Primary RN paged Attending MD and I paged Neuro MD. Pupils round and reactive bilateral, no vision changes, patient denies pain.  Interventions: -- Ordered STAT CT HEAD per Dr. Roseanne Reno -- Patient taken to CT -- CT : age indetermine hypodensity in the left lentiform nucleus, may be chronic in nature, possible acute/early subacute ischemia.  -- Patient have full stroke up done. -- NIH after returning from CT, NIH 7, 1- RLE motor now.  Plan of Care (if not transferred):  Event Summary: Name of Physician Notified: Dr. Donata Clay by Primary RN  at 2320  Name of Consulting Physician Notified: Dr. Roseanne Reno  at 2320  Outcome: Stayed in room and stabalized  Event End Time: 0010  Jamiel Goncalves R

## 2016-11-22 NOTE — Progress Notes (Signed)
  Echocardiogram 2D Echocardiogram has been performed.  Janalyn Harder 11/22/2016, 3:16 PM

## 2016-11-22 NOTE — CV Procedure (Signed)
2D echo attempted but patient went to MRI/CT Angio

## 2016-11-22 NOTE — Progress Notes (Signed)
Patient ID: Jordan Mcintosh, male   DOB: [birth year], 61 y.o.   MRN: [id number]  SICU Evening Rounds:  BP up to 150's this afternoon and labetalol increased to 2 mg. Now 110.  Awake and alert, no chest or back pain. Right arm strength improved today  2D echo ok. 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  CTA head and neck shows no large vessel occlusion or involvement of the left CCA.

## 2016-11-22 NOTE — Progress Notes (Signed)
STROKE TEAM PROGRESS NOTE   HISTORY OF PRESENT ILLNESS (per record) Jordan Mcintosh is an 61 y.o. male with a history of hypertension and noncompliance with treatment who presented early afternoon today with complaint of abdominal pain and back pain. 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 He also had marked hypertension which required emergency intervention with Cardene drip. He currently is on labetalol IV. Neurology consultation was obtained with evaluation of new onset right facial droop and right extremity weakness as well as slurred speech. Patient indicates that he had slurred speech since about 8:00 this morning. Facial droop and right extremity weakness were noted at 11:05 PM tonight. CT scan of his head was obtained which showed no acute intracranial abnormality. Patient is currently on no antiplatelet or anticoagulation agent. He has no previous history of stroke nor TIA. NIH stroke score was 7. His LKW is unclear, possibly at 8 AM this morning 11/22/2016 or around 11 PM tonight. Patient was not administered IV t-PA secondary to unclear time of onset; thoracic aortic ulcer with  Hematoma.  He was admitted to the cardiac ICU for further evaluation and treatment.   SUBJECTIVE (INTERVAL HISTORY) Pt RN at bedside. Pt is awake alert and admitted that he stopped his BP meds and did not take good care of himself. MRI showed left subcortical infarct, seems at left AchA territory.    OBJECTIVE*9 Temp:  [97.9 F (36.6 C)-98.6 F (37 C)] 98.5 F (36.9 C) (04/23 0731) Pulse Rate:  [67-113] 73 (04/23 0800) Cardiac Rhythm: Normal sinus rhythm (04/23 0800) Resp:  [12-26] 17 (04/23 0800) BP: (97-234)/(59-114) 134/70 (04/23 0800) SpO2:  [91 %-99 %] 96 % (04/23 0800)  CBC:  Recent Labs Lab 11/21/16 0923 11/22/16 0037  WBC 9.8 12.9*  HGB 16.3 14.0  HCT 47.0 40.9  MCV 86.4 86.8  PLT 183 174    Basic Metabolic Panel:   Recent Labs Lab 11/21/16 0923 11/21/16 1524 11/22/16 0037  NA 136  --  136  K 3.7  --  3.4*  CL 104  --  103  CO2 23  --  23  GLUCOSE 158*  --  105*  BUN 10  --  12  CREATININE 1.27*  --  1.05  CALCIUM 8.7*  --  8.7*  MG  --  1.8  --     Lipid Panel:    Component Value Date/Time   CHOL 164 11/22/2016 0037   TRIG 117 11/22/2016 0037   HDL 34 (L) 11/22/2016 0037   CHOLHDL 4.8 11/22/2016 0037   VLDL 23 11/22/2016 0037   LDLCALC 107 (H) 11/22/2016 0037   HgbA1c: No results found for: HGBA1C Urine Drug Screen: No results found for: LABOPIA, COCAINSCRNUR, LABBENZ, AMPHETMU, THCU, LABBARB  Alcohol Level No results found for: [facility]  IMAGING I have personally reviewed the radiological images below and agree with the radiology interpretations.  Ct Head Wo Contrast 11/22/2016 1. Age-indeterminate hypodensity involving the left lentiform nucleus as above. While this may be chronic in nature, possible acute/early subacute ischemia could also be considered. Further assessment with dedicated MRI could be performed as clinically desired. 2. No other acute intracranial process identified. 3. Moderate chronic microvascular ischemic disease, with remote lacunar infarcts involving the left basal ganglia and pons.   Mr Lodema Pilot Contrast 11/22/2016 1. CT finding yesterday corresponds to an acute lacunar infarct extending from the left posterior left corona radiata through to the left external  capsule and posterior left lentiform. No associated hemorrhage or mass effect. 2. 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 3. 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   Ct Angio Chest/abd/pel For Dissection W And/or Wo Contrast 11/21/2016 1. There is a penetrating ulcer at the junction of the aortic arch and descending thoracic aorta associated with an intramural  hematoma, with the hematoma extending from the distal arch to just above the aortic hiatus. This is presumably the source of this patient's pain. 2. No aortic dissection. No aneurysm. No other acute abnormality within the chest, abdomen or pelvis. 3. Aortic and branch vessel atherosclerosis. 70% narrowing at the origin of the celiac artery. 50% narrowing at the origin of the superior mesenteric artery. 4. Small hiatal hernia. 5. Low-density right adrenal mass most likely an adenoma. 6. Changes of healed granulomatous disease. 7. Chronic avascular necrosis of both femoral heads.   Ct Angio Neck and head W Or Wo Contrast 11/22/2016 IMPRESSION: 1. No emergent large vessel occlusion. 2. 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 No evidence of left common carotid involvement to explain the infarct. 3. Moderate cervical carotid atherosclerosis without flow limiting stenosis. 4. Advanced atheromatous narrowing of the left vertebral artery origin. 5. Intracranial atherosclerosis with moderate distal right M1 segment and advanced right MCA bifurcations stenosis. Electronically Signed   By: Marnee Spring M.D.   On: 11/22/2016 10:43   TTE pending   PHYSICAL EXAM  Temp:  [97.9 F (36.6 C)-98.6 F (37 C)] 98 F (36.7 C) (04/23 1213) Pulse Rate:  [67-101] 73 (04/23 0800) Resp:  [12-26] 17 (04/23 0800) BP: (97-142)/(59-82) 134/70 (04/23 0800) SpO2:  [91 %-98 %] 96 % (04/23 0800)  General - Well nourished, well developed, in no apparent distress.  Ophthalmologic -  Fundi not visualized due to small pupils.  Cardiovascular - Regular rate and rhythm.  Mental Status -  Level of arousal and orientation to time, place, and person were intact. Language including expression, naming, repetition, comprehension was assessed and found intact. Fund of Knowledge was assessed and was impaired.  Cranial Nerves II - XII - II - Visual field intact OU. III, IV, VI -  Extraocular movements intact. V - Facial sensation intact bilaterally. VII - right facial droop. VIII - Hearing & vestibular intact bilaterally. X - Palate elevates symmetrically. XI - Chin turning & shoulder shrug intact bilaterally. XII - Tongue protrusion intact.  Motor Strength - The patient's strength was normal in all extremities except RUE 3/5 proximal and distal and pronator drift was present on the right.  Bulk was normal and fasciculations were absent.   Motor Tone - Muscle tone was assessed at the neck and appendages and was normal.  Reflexes - The patient's reflexes were 1+ in all extremities and he had no pathological reflexes.  Sensory - Light touch, temperature/pinprick were assessed and were symmetrical.    Coordination - The patient had normal movements in the hands with no ataxia or dysmetria.  Tremor was absent.  Gait and Station - deferred.   ASSESSMENT/PLAN Jordan Mcintosh is a 61 y.o. male with history of HTN not taking meds x 9 mos presenting with epigastric pain, mid back pain, sweats and nausea. He was found to be extremely hypertensive. 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 In hospital he developed R hemiparesis that  was preceded by R facial weakness and slurred speech earlier in the day prior to hospital arrival. He did not receive IV t-PA due to hematoma, delay in arrival.   Stroke:   L posterior corona radiata/external capusule/lentiform nucleus lacunar infarct. The location is more consistent with left AchA territory infarcts. The etiology could be embolic related to aortic origin, or thrombotic due to small vessel disease with stroke risk factors.   Resultant  Right facial droop, right UE weakness  CT head L lentiform nucleus chronic vs subacute infarct. small vessel disease  Old L BG and pontine lacunes.  MRI  Acute L posterior corona radiata/external capusule/lentiform nucleus infarct.  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 9mm pituitary cyst  CTA head/neck atherosclerosis without LVO  2D Echo  pending  LDL 107  HgbA1c pending  UDS pending  SCDs for VTE prophylaxis  Diet full liquid Room service appropriate? Yes; Fluid consistency: Thin  No antithrombotic prior to admission, though wife did give him 1 dose of aspirin prior to EMS arrival, now on No antithrombotic given aortic hematoma. Recommend ASA for stroke prevention whenever cardiac surgery feels safe from aortic hematoma standpoint.   Ongoing aggressive stroke risk factor management  Therapy recommendations:  pending   Disposition:  pending   Thoracic Aorta Penetrating Ulcer/hematoma  CTA shows penetrating thoracic aorta ulcer w/ contained peri-aoritc hematoma   CVTS on board  Could be potential source for AchA infarct as AchA origin from carotid argery  Recommend ASA for stroke prevention whenever cardiac surgery feels safe from aortic hematoma standpoint.  Hypertensive Emergency  Has not taken meds x 9 months  SBP on arrival > 280  Started on cardene. 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 After hours, contact General Neurology

## 2016-11-22 NOTE — Progress Notes (Signed)
  Subjective: prob L embolic CVA with R side weakness- brain MRI pending BP controlled, no ches tor abdominal pain Cont iv labetalol  Objective: Vital signs in last 24 hours: Temp:  [97.6 F (36.4 C)-98.6 F (37 C)] 98.5 F (36.9 C) (04/23 0731) Pulse Rate:  [67-113] 84 (04/23 0700) Cardiac Rhythm: Normal sinus rhythm (04/23 0600) Resp:  [12-26] 16 (04/23 0700) BP: (97-234)/(59-115) 136/74 (04/23 0700) SpO2:  [91 %-99 %] 97 % (04/23 0700) Weight:  [200 lb (90.7 kg)] 200 lb (90.7 kg) (04/22 0930)  Hemodynamic parameters for last 24 hours:  stable  Intake/Output from previous day: 04/22 0701 - 04/23 0700 In: 2174.2 [P.O.:960; I.V.:1114.2; IV Piggyback:100] Out: 1025 [Urine:1025] Intake/Output this shift: No intake/output data recorded.       Exam    General- alert and comfortable   Lungs- clear without rales, wheezes   Cor- regular rate and rhythm, no murmur , gallop   Abdomen- soft, non-tender   Extremities - warm, non-tender, minimal edema   Neuro- oriented, appropriate, mild R side weakness   Lab Results:  Recent Labs  11/21/16 0923 11/22/16 0037  WBC 9.8 12.9*  HGB 16.3 14.0  HCT 47.0 40.9  PLT 183 174   BMET:  Recent Labs  11/21/16 0923 11/22/16 0037  NA 136 136  K 3.7 3.4*  CL 104 103  CO2 23 23  GLUCOSE 158* 105*  BUN 10 12  CREATININE 1.27* 1.05  CALCIUM 8.7* 8.7*    PT/INR:  Recent Labs  11/21/16 1524  LABPROT 13.1  INR 0.99   ABG No results found for: PHART, HCO3, TCO2, ACIDBASEDEF, O2SAT CBG (last 3)   Recent Labs  11/22/16 0015  GLUCAP 100*    Assessment/Plan: S/P  Penetrating ulcer/ hematoma of descending thoracic   aorta R body CVA Uncontrolled HTN  Cont current care  LOS: 1 day    [name] 11/22/2016

## 2016-11-22 NOTE — Consult Note (Signed)
Admission H&P    Chief Complaint: New onset right-sided weakness and slurred speech.  HPI: Jordan Mcintosh is an 61 y.o. male with a history of hypertension and noncompliance with treatment who presented early afternoon today with complaint of abdominal pain and back pain. 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 He also had marked hypertension which required emergency intervention with Cardene drip. He currently is on labetalol IV. Neurology consultation was obtained with evaluation of new onset right facial droop and right extremity weakness as well as slurred speech. Patient indicates that he had slurred speech since about 8:00 this morning. Facial droop and right extremity weakness were noted at 11:05 PM tonight. CT scan of his head was obtained which showed no acute intracranial abnormality. Patient is currently on no antiplatelet or anticoagulation agent. He has no previous history of stroke nor TIA. NIH stroke score was 7.  LSN:  Unclear of stroke onset was at 8 AM this morning or around 11 PM tonight. tPA Given: No: Unclear time of onset; thoracic aortic ulcer with hematoma mRankin:  Past Medical History:  Diagnosis Date  . Hypertension     History reviewed. No pertinent surgical history.  History reviewed. No pertinent family history. Social History:  reports that he has quit smoking. He has never used smokeless tobacco. He reports that he uses drugs, including Marijuana. He reports that he does not drink alcohol.  Allergies:  Allergies  Allergen Reactions  . Codeine Nausea Only    No prescriptions prior to admission.    ROS: History obtained from the patient  General ROS: negative for - chills, fatigue, fever, night sweats, weight gain or weight loss Psychological ROS: negative for - behavioral disorder, hallucinations, memory difficulties, mood swings or suicidal ideation Ophthalmic ROS: negative  for - blurry vision, double vision, eye pain or loss of vision ENT ROS: negative for - epistaxis, nasal discharge, oral lesions, sore throat, tinnitus or vertigo Allergy and Immunology ROS: negative for - hives or itchy/watery eyes Hematological and Lymphatic ROS: negative for - bleeding problems, bruising or swollen lymph nodes Endocrine ROS: negative for - galactorrhea, hair pattern changes, polydipsia/polyuria or temperature intolerance Respiratory ROS: negative for - cough, hemoptysis, shortness of breath or wheezing Cardiovascular ROS: As noted in history of present illness Gastrointestinal ROS: negative for - abdominal pain, diarrhea, hematemesis, nausea/vomiting or stool incontinence Genito-Urinary ROS: negative for - dysuria, hematuria, incontinence or urinary frequency/urgency Musculoskeletal ROS: negative for - joint swelling or muscular weakness Neurological ROS: as noted in HPI Dermatological ROS: negative for rash and skin lesion changes  Physical Examination: Blood pressure 106/61, pulse 79, temperature 98.1 F (36.7 C), temperature source Oral, resp. rate 16, height '5\' 11"'  (1.803 m), weight 90.7 kg (200 lb), SpO2 96 %.  HEENT-  Normocephalic, no lesions, without obvious abnormality.  Normal external eye and conjunctiva.  Normal TM's bilaterally.  Normal auditory canals and external ears. Normal external nose, mucus membranes and septum.  Normal pharynx. Neck supple with no masses, nodes, nodules or enlargement. Cardiovascular - regular rate and rhythm, S1, S2 normal, no murmur, click, rub or gallop Lungs - chest clear, no wheezing, rales, normal symmetric air entry Abdomen - soft, non-tender; bowel sounds normal; no masses,  no organomegaly Extremities - no joint deformities, effusion, or inflammation and no edema  Neurologic Examination: Mental Status: Alert, oriented, no acute distress.  Speech moderately slurred without evidence of aphasia. Able to follow commands without  difficulty. Cranial Nerves: II-Visual fields were normal. III/IV/VI-Pupils were equal and reacted normally to light. Extraocular movements were full and conjugate.    V/VII-no facial numbness; moderate right lower facial weakness. VIII-normal. X-moderate dysarthria. XII-midline tongue extension with normal strength. Motor: Mild weakness proximally and moderate weakness distally of right upper extremity flaccid muscle tone; mild proximal weakness of right lower extremity normal muscle tone; normal strength and tone of left extremities. Sensory: Normal throughout. Deep Tendon Reflexes: 1+ and symmetric. Plantars: Mute bilaterally Cerebellar: Moderately impaired coordination of right upper and lower extremities. Carotid auscultation: Normal  Results for orders placed or performed during the hospital encounter of 11/21/16 (from the past 48 hour(s))  CBC     Status: None   Collection Time: 11/21/16  9:23 AM  Result Value Ref Range   WBC 9.8 4.0 - 10.5 K/uL   RBC 5.44 4.22 - 5.81 MIL/uL   Hemoglobin 16.3 13.0 - 17.0 g/dL   HCT 47.0 39.0 - 52.0 %   MCV 86.4 78.0 - 100.0 fL   MCH 30.0 26.0 - 34.0 pg   MCHC 34.7 30.0 - 36.0 g/dL   RDW 13.0 11.5 - 15.5 %   Platelets 183 150 - 400 K/uL  D-dimer, quantitative     Status: Abnormal   Collection Time: 11/21/16  9:23 AM  Result Value Ref Range   D-Dimer, Quant 2.06 (H) 0.00 - 0.50 ug/mL-FEU    Comment: (NOTE) At the manufacturer cut-off of 0.50 ug/mL FEU, this assay has been documented to exclude PE with a sensitivity and negative predictive value of 97 to 99%.  At this time, this assay has not been approved by the FDA to exclude DVT/VTE. Results should be correlated with clinical presentation.   Comprehensive metabolic panel     Status: Abnormal   Collection Time: 11/21/16  9:23 AM  Result Value Ref Range   Sodium 136 135 - 145 mmol/L   Potassium 3.7 3.5 - 5.1 mmol/L   Chloride 104 101 - 111 mmol/L   CO2 23 22 - 32 mmol/L   Glucose,  Bld 158 (H) 65 - 99 mg/dL   BUN 10 6 - 20 mg/dL   Creatinine, Ser 1.27 (H) 0.61 - 1.24 mg/dL   Calcium 8.7 (L) 8.9 - 10.3 mg/dL   Total Protein 7.2 6.5 - 8.1 g/dL   Albumin 4.4 3.5 - 5.0 g/dL   AST 20 15 - 41 U/L   ALT 17 17 - 63 U/L   Alkaline Phosphatase 70 38 - 126 U/L   Total Bilirubin 1.0 0.3 - 1.2 mg/dL   GFR calc non Af Amer 60 (L) >60 mL/min   GFR calc Af Amer >60 >60 mL/min    Comment: (NOTE) The eGFR has been calculated using the CKD EPI equation. This calculation has not been validated in all clinical situations. eGFR's persistently <60 mL/min signify possible Chronic Kidney Disease.    Anion gap 9 5 - 15  Troponin I     Status: None   Collection Time: 11/21/16  9:23 AM  Result Value Ref Range   Troponin I <0.03 <0.03 ng/mL  I-stat troponin, ED     Status: None   Collection Time: 11/21/16  9:31 AM  Result Value Ref Range   Troponin i, poc 0.00 0.00 - 0.08 ng/mL   Comment 3            Comment: Due to the release kinetics of cTnI, a negative result within the first hours of the onset of  symptoms does not rule out myocardial infarction with certainty. If myocardial infarction is still suspected, repeat the test at appropriate intervals.   Urinalysis, Complete w Microscopic     Status: Abnormal   Collection Time: 11/21/16  1:43 PM  Result Value Ref Range   Color, Urine YELLOW YELLOW   APPearance CLEAR CLEAR   Specific Gravity, Urine >1.046 (H) 1.005 - 1.030   pH 5.0 5.0 - 8.0   Glucose, UA NEGATIVE NEGATIVE mg/dL   Hgb urine dipstick SMALL (A) NEGATIVE   Bilirubin Urine NEGATIVE NEGATIVE   Ketones, ur 20 (A) NEGATIVE mg/dL   Protein, ur NEGATIVE NEGATIVE mg/dL   Nitrite NEGATIVE NEGATIVE   Leukocytes, UA NEGATIVE NEGATIVE   RBC / HPF 0-5 0 - 5 RBC/hpf   WBC, UA 0-5 0 - 5 WBC/hpf   Bacteria, UA NONE SEEN NONE SEEN   Squamous Epithelial / LPF 0-5 (A) NONE SEEN  MRSA PCR Screening     Status: None   Collection Time: 11/21/16  2:27 PM  Result Value Ref Range    MRSA by PCR NEGATIVE NEGATIVE    Comment:        The GeneXpert MRSA Assay (FDA approved for NASAL specimens only), is one component of a comprehensive MRSA colonization surveillance program. It is not intended to diagnose MRSA infection nor to guide or monitor treatment for MRSA infections.   Troponin I     Status: Abnormal   Collection Time: 11/21/16  3:24 PM  Result Value Ref Range   Troponin I 0.14 (HH) <0.03 ng/mL    Comment: CRITICAL RESULT CALLED TO, READ BACK BY AND VERIFIED WITH: RN Lenna Sciara [facility] AT [phone] MARTINB   Protime-INR     Status: None   Collection Time: 11/21/16  3:24 PM  Result Value Ref Range   Prothrombin Time 13.1 11.4 - 15.2 seconds   INR 0.99   Magnesium     Status: None   Collection Time: 11/21/16  3:24 PM  Result Value Ref Range   Magnesium 1.8 1.7 - 2.4 mg/dL  Troponin I     Status: Abnormal   Collection Time: 11/21/16  6:43 PM  Result Value Ref Range   Troponin I 0.45 (HH) <0.03 ng/mL    Comment: CRITICAL VALUE NOTED.  VALUE IS CONSISTENT WITH PREVIOUSLY REPORTED AND CALLED VALUE.   Dg Chest Port 1 View  Result Date: 11/21/2016 CLINICAL DATA:  Patient from home via [facility]. Patient reports back pain that started this morning and radiates to his epigastrium. Per [facility] patient blood pressure 260/140 on arrival and patient was diaphoretic Patient reports not taking blood pressure medications in nine months. Hx of hypertension EXAM: PORTABLE CHEST 1 VIEW COMPARISON:  None. FINDINGS: Cardiac silhouette is normal in size. No mediastinal or hilar masses. No evidence of adenopathy. Clear lungs.  No pleural effusion or pneumothorax. Skeletal structures are intact. IMPRESSION: No active disease. Electronically Signed   By: Lajean Manes M.D.   On: 11/21/2016 09:44   Ct Angio Chest/abd/pel For Dissection W And/or Wo Contrast  Result Date: 11/21/2016 CLINICAL DATA:  Patient reports back pain that started this morning and radiates to his  epigastrium. Per [facility] patient blood pressure 260/140 on arrival and patient was diaphoretic Patient reports not taking blood pressure medications in nine months. Wife gave aspirinHx of hernia repairNo hx of CA EXAM: CT ANGIOGRAPHY CHEST, ABDOMEN AND PELVIS TECHNIQUE: Multidetector CT imaging through the chest, abdomen and pelvis was performed using the standard  protocol during bolus administration of intravenous contrast. Multiplanar reconstructed images and MIPs were obtained and reviewed to evaluate the vascular anatomy. CONTRAST:  100 mL of Isovue 370 intravenous contrast COMPARISON:  Current chest radiograph FINDINGS: CTA CHEST FINDINGS Cardiovascular: The thoracic aorta is normal in caliber. At the junction of the distal arch and descending thoracic aorta, there is a penetrating ulcer associated with an intramural hematoma. The hematoma extends from the distal aortic arch through the descending thoracic aorta to the level of the aortic hiatus. There is no dissection. There is mild atherosclerosis at the origin of the arch branch vessels with no significant stenosis. The heart is normal in size. There are moderate coronary artery calcifications. Pulmonary arteries are normal in caliber. Mediastinum/Nodes: No neck base, axillary, mediastinal or hilar masses or adenopathy. Trachea is widely patent. There is a small sliding hiatal hernia. Esophagus is unremarkable. Lungs/Pleura: Minor dependent subsegmental atelectasis. No pneumonia or pulmonary edema. No mass or suspicious nodule. No pleural effusion or pneumothorax. Musculoskeletal: No fracture or acute finding. No osteoblastic or osteolytic lesions. Review of the MIP images confirms the above findings. CTA ABDOMEN AND PELVIS FINDINGS VASCULAR Aorta: Aorta is normal in caliber. Atherosclerotic plaque is noted throughout the aorta with no significant stenosis. No dissection. Celiac: Atherosclerotic plaque at the origin of the celiac artery causes a 70%  narrowing. SMA: Eccentric plaque at the origin of the superior mesenteric artery causes a 50% narrowing. Renals: Mild plaque at the origin of single renal arteries bilaterally is noted without significant stenosis. IMA: Plaque at the origin without significant stenosis. Inflow: Mild atherosclerosis of the common iliac arteries without stenosis. Atherosclerotic change involving both common femoral arteries without significant stenosis. Veins: No obvious venous abnormality within the limitations of this arterial phase study. Review of the MIP images confirms the above findings. NON-VASCULAR Hepatobiliary: The liver calcifications consistent healed granuloma. Liver otherwise unremarkable. Normal gallbladder. No bile duct dilation. Pancreas: Unremarkable. No pancreatic ductal dilatation or surrounding inflammatory changes. Spleen: Splenic calcifications consistent healed granuloma. Spleen otherwise unremarkable. Adrenals/Urinary Tract: Low-density, 14 mm right adrenal mass, consistent with an adenoma. Normal left adrenal gland. Small low-density lesions in the right kidney, likely cysts. Small nonobstructing stone in the lower pole the right kidney. No left renal masses or cysts. Mild bilateral renal cortical thinning. No hydronephrosis. Normal ureters. Bladder is unremarkable. Stomach/Bowel: Stomach unremarkable other than the small hiatal hernia. Normal small bowel. No colonic dilation, wall thickening or inflammation. Appendix not visualized. Lymphatic: No enlarged lymph nodes. Reproductive: Unremarkable. Other: No abdominal wall hernia or abnormality. No abdominopelvic ascites. 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 No fracture or acute finding. No osteoblastic or osteolytic lesions. Review of the MIP images confirms the above findings. IMPRESSION: 1. There is a penetrating ulcer at the junction of the aortic arch and descending thoracic aorta associated with an intramural hematoma, with  the hematoma extending from the distal arch to just above the aortic hiatus. This is presumably the source of this patient's pain. 2. No aortic dissection. No aneurysm. No other acute abnormality within the chest, abdomen or pelvis. 3. Aortic and branch vessel atherosclerosis. 70% narrowing at the origin of the celiac artery. 50% narrowing at the origin of the superior mesenteric artery. 4. Small hiatal hernia. 5. Low-density right adrenal mass most likely an adenoma. 6. Changes of healed granulomatous disease. 7. Chronic avascular necrosis of both femoral heads. Electronically Signed   By: Lajean Manes M.D.   On: 11/21/2016 11:27    Assessment:  61 y.o. male  with a history of hypertension and noncompliance with treatment with new onset left cerebral infarction, less likely subcortical small vessel involvement related to chronic hypertension.  Stroke Risk Factors - family history and hypertension  Plan: 1. HgbA1c, fasting lipid panel 2. MRI, MRA  of the brain without contrast 3. PT consult, OT consult, Speech consult 4. Echocardiogram 5. Carotid dopplers 6. Prophylactic therapy-None 7. Risk factor modification 8. Telemetry monitoring  C.R. Nicole Kindred, MD [facility] [phone]  11/22/2016, 12:10 AM

## 2016-11-23 ENCOUNTER — Inpatient Hospital Stay (HOSPITAL_COMMUNITY): Payer: BLUE CROSS/BLUE SHIELD

## 2016-11-23 DIAGNOSIS — E785 Hyperlipidemia, unspecified: Secondary | ICD-10-CM

## 2016-11-23 DIAGNOSIS — I10 Essential (primary) hypertension: Secondary | ICD-10-CM

## 2016-11-23 LAB — COMPREHENSIVE METABOLIC PANEL
ALT: 12 U/L — ABNORMAL LOW (ref 17–63)
AST: 18 U/L (ref 15–41)
Albumin: 3.2 g/dL — ABNORMAL LOW (ref 3.5–5.0)
Alkaline Phosphatase: 54 U/L (ref 38–126)
Anion gap: 8 (ref 5–15)
BUN: 11 mg/dL (ref 6–20)
CO2: 21 mmol/L — ABNORMAL LOW (ref 22–32)
Calcium: 8.4 mg/dL — ABNORMAL LOW (ref 8.9–10.3)
Chloride: 108 mmol/L (ref 101–111)
Creatinine, Ser: 0.97 mg/dL (ref 0.61–1.24)
GFR calc Af Amer: >60 mL/min (ref >60)
GFR calc non Af Amer: >60 mL/min (ref >60)
Glucose, Bld: 97 mg/dL (ref 65–99)
Potassium: 4.1 mmol/L (ref 3.5–5.1)
Sodium: 137 mmol/L (ref 135–145)
Total Bilirubin: 0.8 mg/dL (ref 0.3–1.2)
Total Protein: 5.6 g/dL — ABNORMAL LOW (ref 6.5–8.1)

## 2016-11-23 LAB — CBC
HCT: 40.4 % (ref 39.0–52.0)
Hemoglobin: 13.3 g/dL (ref 13.0–17.0)
MCH: 29.2 pg (ref 26.0–34.0)
MCHC: 32.9 g/dL (ref 30.0–36.0)
MCV: 88.6 fL (ref 78.0–100.0)
Platelets: 153 10*3/uL (ref 150–400)
RBC: 4.56 MIL/uL (ref 4.22–5.81)
RDW: 13.8 % (ref 11.5–15.5)
WBC: 9.4 10*3/uL (ref 4.0–10.5)

## 2016-11-23 LAB — HIV ANTIBODY (ROUTINE TESTING W REFLEX): HIV SCREEN 4TH GENERATION: NONREACTIVE

## 2016-11-23 LAB — RPR: RPR Ser Ql: NONREACTIVE

## 2016-11-23 MED ORDER — "THROMBI-PAD 3""X3"" EX PADS"
1.0000 | MEDICATED_PAD | Freq: Once | CUTANEOUS | Status: DC
  Filled 2016-11-23: qty 1

## 2016-11-23 MED ORDER — LOSARTAN POTASSIUM 50 MG PO TABS
100.0000 mg | ORAL_TABLET | Freq: Every day | ORAL | Status: DC
  Administered 2016-11-23: 100 mg via ORAL
  Filled 2016-11-23 (×2): qty 2

## 2016-11-23 MED ORDER — LABETALOL HCL 5 MG/ML IV SOLN
10.0000 mg | INTRAVENOUS | Status: DC | PRN
Start: 2016-11-23 — End: 2016-11-27
  Administered 2016-11-24 – 2016-11-26 (×7): 10 mg via INTRAVENOUS
  Filled 2016-11-23 (×5): qty 4

## 2016-11-23 NOTE — Plan of Care (Signed)
Problem: Education: Goal: Knowledge of secondary prevention will improve Outcome: Not Progressing Pt still confused intermittently   Problem: Coping: Goal: Ability to identify appropriate support needs will improve Outcome: Progressing Separated from wife, children not involved, states he can stay with ex-wife

## 2016-11-23 NOTE — Progress Notes (Signed)
STROKE TEAM PROGRESS NOTE   SUBJECTIVE (INTERVAL HISTORY) No family is at bedside. Was on labetalol drip overnight for BP control. Currently off labetalol and BP stable. Neuro no change.     OBJECTIVE*9 Temp:  [98.5 F (36.9 C)-99.4 F (37.4 C)] 99.1 F (37.3 C) (04/24 1100) Pulse Rate:  [78-90] 82 (04/24 0800) Cardiac Rhythm: Normal sinus rhythm (04/24 0756) Resp:  [11-28] 25 (04/24 0800) BP: (100-155)/(57-82) 136/82 (04/24 0800) SpO2:  [92 %-98 %] 95 % (04/24 0800)  CBC:   Recent Labs Lab 11/22/16 0037 11/23/16 0223  WBC 12.9* 9.4  HGB 14.0 13.3  HCT 40.9 40.4  MCV 86.8 88.6  PLT 174 153    Basic Metabolic Panel:   Recent Labs Lab 11/21/16 1524 11/22/16 0037 11/23/16 0223  NA  --  136 137  K  --  3.4* 4.1  CL  --  103 108  CO2  --  23 21*  GLUCOSE  --  105* 97  BUN  --  12 11  CREATININE  --  1.05 0.97  CALCIUM  --  8.7* 8.4*  MG 1.8  --   --     Lipid Panel:     Component Value Date/Time   CHOL 164 11/22/2016 0037   TRIG 117 11/22/2016 0037   HDL 34 (L) 11/22/2016 0037   CHOLHDL 4.8 11/22/2016 0037   VLDL 23 11/22/2016 0037   LDLCALC 107 (H) 11/22/2016 0037   HgbA1c:  Lab Results  Component Value Date   HGBA1C 5.9 (H) 11/22/2016   Urine Drug Screen:     Component Value Date/Time   LABOPIA POSITIVE (A) 11/21/2016 1343   COCAINSCRNUR NONE DETECTED 11/21/2016 1343   LABBENZ NONE DETECTED 11/21/2016 1343   AMPHETMU NONE DETECTED 11/21/2016 1343   THCU NONE DETECTED 11/21/2016 1343   LABBARB NONE DETECTED 11/21/2016 1343    Alcohol Level No results found for: ETH  IMAGING I have personally reviewed the radiological images below and agree with the radiology interpretations.  Ct Head Wo Contrast 11/22/2016 1. Age-indeterminate hypodensity involving the left lentiform nucleus as above. While this may be chronic in nature, possible acute/early subacute ischemia could also be considered. Further assessment with dedicated MRI could be performed  as clinically desired. 2. No other acute intracranial process identified. 3. Moderate chronic microvascular ischemic disease, with remote lacunar infarcts involving the left basal ganglia and pons.   Mr Lodema Pilot Contrast 11/22/2016 1. CT finding yesterday corresponds to an acute lacunar infarct extending from the left posterior left corona radiata through to the left external capsule and posterior left lentiform. No associated hemorrhage or mass effect. 2. 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 3. 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   Ct Angio Chest/abd/pel For Dissection W And/or Wo Contrast 11/21/2016 1. There is a penetrating ulcer at the junction of the aortic arch and descending thoracic aorta associated with an intramural hematoma, with the hematoma extending from the distal arch to just above the aortic hiatus. This is presumably the source of this patient's pain. 2. No aortic dissection. No aneurysm. No other acute abnormality within the chest, abdomen or pelvis. 3. Aortic and branch vessel atherosclerosis. 70% narrowing at the origin of the celiac artery. 50% narrowing at the origin of the superior mesenteric artery. 4. Small hiatal hernia. 5. Low-density right adrenal mass most likely an  adenoma. 6. Changes of healed granulomatous disease. 7. Chronic avascular necrosis of both femoral heads.   Ct Angio Neck and head W Or Wo Contrast 11/22/2016 IMPRESSION: 1. No emergent large vessel occlusion. 2. 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 No evidence of left common carotid involvement to explain the infarct. 3. Moderate cervical carotid atherosclerosis without flow limiting stenosis. 4. Advanced atheromatous narrowing of the left vertebral artery origin. 5. Intracranial atherosclerosis with moderate distal right  M1 segment and advanced right MCA bifurcations stenosis. Electronically Signed   By: Marnee Spring M.D.   On: 11/22/2016 10:43   TTE - Left ventricle: The cavity size was normal. Systolic function was   normal. The estimated ejection fraction was in the range of 55%   to 60%. Wall motion was normal; there were no regional wall   motion abnormalities. Left ventricular diastolic function   parameters were normal.   PHYSICAL EXAM  Temp:  [98.5 F (36.9 C)-99.4 F (37.4 C)] 99.1 F (37.3 C) (04/24 1100) Pulse Rate:  [78-90] 82 (04/24 0800) Resp:  [11-28] 25 (04/24 0800) BP: (100-155)/(57-82) 136/82 (04/24 0800) SpO2:  [92 %-98 %] 95 % (04/24 0800)  General - Well nourished, well developed, in no apparent distress.  Ophthalmologic -  Fundi not visualized due to small pupils.  Cardiovascular - Regular rate and rhythm.  Mental Status -  Level of arousal and orientation to time, place, and person were intact. Language including expression, naming, repetition, comprehension was assessed and found intact. Fund of Knowledge was assessed and was impaired.  Cranial Nerves II - XII - II - Visual field intact OU. III, IV, VI - Extraocular movements intact. V - Facial sensation intact bilaterally. VII - right facial droop. VIII - Hearing & vestibular intact bilaterally. X - Palate elevates symmetrically. XI - Chin turning & shoulder shrug intact bilaterally. XII - Tongue protrusion intact.  Motor Strength - The patient's strength was normal in all extremities except RUE 3/5 proximal and distal and pronator drift was present on the right.  Bulk was normal and fasciculations were absent.   Motor Tone - Muscle tone was assessed at the neck and appendages and was normal.  Reflexes - The patient's reflexes were 1+ in all extremities and he had no pathological reflexes.  Sensory - Light touch, temperature/pinprick were assessed and were symmetrical.    Coordination - The patient had  normal movements in the hands with no ataxia or dysmetria.  Tremor was absent.  Gait and Station - deferred.   ASSESSMENT/PLAN Mr. Jordan Mcintosh is a 61 y.o. male with history of HTN not taking meds x 9 mos presenting with epigastric pain, mid back pain, sweats and nausea. He was found to be extremely hypertensive. 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 In hospital he developed R hemiparesis that was preceded by R facial weakness and slurred speech earlier in the day prior to hospital arrival. He did not receive IV t-PA due to hematoma, delay in arrival.   Stroke:   L posterior corona radiata/external capusule/lentiform nucleus lacunar infarct. The location is more consistent with left AchA territory infarcts. The etiology could be embolic related to aortic origin, or thrombotic due to small vessel disease with stroke risk factors.   Resultant  Right facial droop, right UE weakness  CT head L lentiform nucleus chronic vs subacute infarct. small vessel disease  Old L BG and pontine lacunes.  MRI  Acute L posterior corona  radiata/external capusule/lentiform nucleus infarct.  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 9mm pituitary cyst  CTA head/neck atherosclerosis without LVO  2D Echo  EF 55-60%  LDL 107  HgbA1c 5.9  SCDs for VTE prophylaxis Diet Heart Room service appropriate? Yes; Fluid consistency: Thin  No antithrombotic prior to admission, though wife did give him 1 dose of aspirin prior to EMS arrival, now on No antithrombotic given aortic hematoma. Recommend ASA for stroke prevention whenever cardiac surgery feels safe from aortic hematoma standpoint.   Ongoing aggressive stroke risk factor management  Therapy recommendations:  pending   Disposition:  pending   Thoracic Aorta Penetrating Ulcer/hematoma  CTA shows penetrating thoracic aorta ulcer w/ contained peri-aoritc hematoma   CVTS on board  Could be potential source for AchA  infarct as AchA origin from carotid argery  Recommend ASA for stroke prevention whenever cardiac surgery feels safe from aortic hematoma standpoint.  Hypertensive Emergency  Has not taken meds x 9 months  SBP on arrival > 280  Started on cardene. Changed to labetalol drip for control  BP stable now  BP goal < 140 due to aortic hematoma  Hyperlipidemia  Home meds:  No statin  LDL 107, goal < 70  Add lipitor   Continue statin at discharge  Other Stroke Risk Factors  Former Cigarette smoker, quit 9 years now  Hx of cocaine use, last use 10 years ago  Hx marijuana abuse in the past  Hx extensive gasoline sniffing as a teenager  Hx of alcohol abuse, last drink 9 years ago  Other Active Problems    Hospital day # 2  Neurology will sign off. Please call with questions. Pt will follow up with Dr. Roda Shutters at [facility] in about 6 weeks. Thanks for the consult.   Marvel Plan, MD PhD Stroke Neurology 11/23/2016 12:53 PM   To contact Stroke Continuity provider, please refer to [email or web address]. After hours, contact General Neurology

## 2016-11-23 NOTE — Progress Notes (Signed)
CSW received consult that patient has financial concerns since will be unable to work immediately following CVA.    CSW met with pt to discuss options- provided pt with information regarding Short Term Disability and provided with paper copy of application- encouraged pt to apply online, in person, or through mail.  Informed he will need to make employer aware of him applying and will need employer to supply information to [employer] through [employer].  Pt reports his wife can help him with this process  Pt does not have any other CSW needs at this time- encouraged pt to inform RN if further questions or needs arise  CSW signing off  [name], [place] Social Worker [phone]

## 2016-11-23 NOTE — Progress Notes (Signed)
  Subjective: BP better - wean off iv labetalol Up in chair No chest pain Will need CIR after acute hospital care Objective: Vital signs in last 24 hours: Temp:  [98 F (36.7 C)-99.4 F (37.4 C)] 98.6 F (37 C) (04/24 0700) Pulse Rate:  [76-90] 84 (04/24 0700) Cardiac Rhythm: Normal sinus rhythm (04/24 0756) Resp:  [11-28] 18 (04/24 0700) BP: (100-155)/(57-99) 126/77 (04/24 0700) SpO2:  [92 %-98 %] 97 % (04/24 0700)  Hemodynamic parameters for last 24 hours:  stable  Intake/Output from previous day: 04/23 0701 - 04/24 0700 In: 2455.4 [P.O.:720; I.V.:1283.4; IV Piggyback:452] Out: 2525 [Urine:2525] Intake/Output this shift: No intake/output data recorded.       Exam    General- alert and comfortable   Lungs- clear without rales, wheezes   Cor- regular rate and rhythm, no murmur , gallop   Abdomen- soft, non-tender   Extremities - warm, non-tender, minimal edema   Neuro- oriented, appropriate, no focal weakness   Lab Results:  Recent Labs  11/22/16 0037 11/23/16 0223  WBC 12.9* 9.4  HGB 14.0 13.3  HCT 40.9 40.4  PLT 174 153   BMET:  Recent Labs  11/22/16 0037 11/23/16 0223  NA 136 137  K 3.4* 4.1  CL 103 108  CO2 23 21*  GLUCOSE 105* 97  BUN 12 11  CREATININE 1.05 0.97  CALCIUM 8.7* 8.4*    PT/INR:  Recent Labs  11/21/16 1524  LABPROT 13.1  INR 0.99   ABG No results found for: PHART, HCO3, TCO2, ACIDBASEDEF, O2SAT CBG (last 3)   Recent Labs  11/22/16 0015  GLUCAP 100*    Assessment/Plan: S/P  Cont BP control PT OT for hypertensive CVA   LOS: 2 days    [name] 11/23/2016

## 2016-11-23 NOTE — Care Management Note (Signed)
Case Management Note Donn Pierini RN, BSN Unit 2W-Case Manager [phone]  Patient Details  Name: Jordan Mcintosh MRN: [id number] Date of Birth: [birth year]  Subjective/Objective:   Pt admitted with uncontrolled HTN, +CVA                 Action/Plan: PTA pt lived at home with wife- independent- stroke w/o in progress- PT/OT evals pending-- CM to follow for recommnendations  Expected Discharge Date:                  Expected Discharge Plan:  IP Rehab Facility  In-House Referral:  Clinical Social Work  Discharge planning Services  CM Consult  Post Acute Care Choice:    Choice offered to:     DME Arranged:    DME Agency:     HH Arranged:    HH Agency:     Status of Service:  In process, will continue to follow  If discussed at Long Length of Stay Meetings, dates discussed:    Additional Comments:  Darrold Span, RN 11/23/2016, 3:24 PM

## 2016-11-23 NOTE — Evaluation (Signed)
Clinical/Bedside Swallow Evaluation Patient Details  Name: Jordan Mcintosh MRN: [id number] Date of Birth: 10-24-1955  Today's Date: 11/23/2016 Time: SLP Start Time (ACUTE ONLY): 1340 SLP Stop Time (ACUTE ONLY): 1400 SLP Time Calculation (min) (ACUTE ONLY): 20 min  Past Medical History:  Past Medical History:  Diagnosis Date  . Hypertension    Past Surgical History: History reviewed. No pertinent surgical history. HPI:  61 y.o.malewith history of HTN not taking meds x 9 mos presenting with epigastric pain, mid back pain, sweats and nausea. He was found to be extremely hypertensive. 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 In hospital he developed R hemiparesis that was preceded by R facial weakness and slurred speech earlier in the day prior to hospital arrival. Hedidnot receive IV t-PA due to hematoma, delay in arrival. MRI:  acute left posterior corona radiata/external capusule/lentiform nucleus infarct. 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   Assessment / Plan / Recommendation Clinical Impression  Pt presents with functional oropharyngeal swallow with no indications of dysphagia - adequate mastication despite right focal CN deficits; brisk swallow response; no s/s of aspiration, despite taxing swallow with large, successive boluses.  No SLP f/u for swallowing is warranted - however, given neuro dx, pt would benefit from cognitive-linguistic evaluation.   SLP Visit Diagnosis: Dysphagia, unspecified (R13.10)    Aspiration Risk  No limitations    Diet Recommendation   regular diet, thin liquids.  Medication Administration: Whole meds with liquid    Other  Recommendations Oral Care Recommendations: Oral care BID   Follow up Recommendations None      Frequency and Duration            Prognosis        Swallow Study   General Date of Onset: 11/21/16 HPI: 61 y.o.malewith history of HTN not taking meds x 9 mos presenting with  epigastric pain, mid back pain, sweats and nausea. He was found to be extremely hypertensive. 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 In hospital he developed R hemiparesis that was preceded by R facial weakness and slurred speech earlier in the day prior to hospital arrival. Hedidnot receive IV t-PA due to hematoma, delay in arrival. MRI:  acute left posterior corona radiata/external capusule/lentiform nucleus infarct. 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 Type of Study: Bedside Swallow Evaluation Previous Swallow Assessment: no Diet Prior to this Study: Regular;Thin liquids Temperature Spikes Noted: Yes Respiratory Status: Room air History of Recent Intubation: No Behavior/Cognition: Alert;Cooperative Oral Cavity Assessment: Within Functional Limits Oral Care Completed by SLP: No Oral Cavity - Dentition: Adequate natural dentition Vision: Functional for self-feeding Self-Feeding Abilities: Able to feed self Patient Positioning: Upright in bed Baseline Vocal Quality: Normal Volitional Cough: Strong Volitional Swallow: Able to elicit    Oral/Motor/Sensory Function Overall Oral Motor/Sensory Function: Mild impairment Facial ROM: Reduced left;Suspected CN VII (facial) dysfunction Facial Symmetry: Abnormal symmetry right;Suspected CN VII (facial) dysfunction Facial Strength: Reduced right;Suspected CN VII (facial) dysfunction   Ice Chips Ice chips: Within functional limits   Thin Liquid Thin Liquid: Within functional limits Presentation: Cup;Straw    Nectar Thick Nectar Thick Liquid: Not tested   Honey Thick Honey Thick Liquid: Not tested   Puree Puree: Within functional limits   Solid   GO   Solid: Within functional limits        Blenda Mounts Laurice 11/23/2016,2:07 PM

## 2016-11-23 NOTE — Progress Notes (Signed)
Patient ID: Jordan Mcintosh, male   DOB: 12/12/1955, 61 y.o.   MRN: [id number] EVENING ROUNDS NOTE :     [address]       [place] [address]             [phone]                     Total Length of Stay:  LOS: 2 days  BP (!) 129/96   Pulse (!) 25   Temp 98.2 F (36.8 C) (Oral)   Resp 12   Ht  (1.803 m)   Wt 200 lb (90.7 kg)   SpO2 95%   BMI 27.89 kg/m   .Intake/Output      04/23 0701 - 04/24 0700 04/24 0701 - 04/25 0700   P.O. 720 240   I.V. (mL/kg) 1283.4 (14.1) 564 (6.2)   IV Piggyback 452 100   Total Intake(mL/kg) 2455.4 (27.1) 904 (10)   Urine (mL/kg/hr) 2525 (1.2) 300 (0.3)   Total Output 2525 300   Net -69.6 +604        Urine Occurrence 1 x 700 x     . sodium chloride 50 mL/hr at 11/23/16 0700  . famotidine (PEPCID) IV Stopped (11/23/16 2130)  . niCARDipine Stopped (11/21/16 1503)     Lab Results  Component Value Date   WBC 9.4 11/23/2016   HGB 13.3 11/23/2016   HCT 40.4 11/23/2016   PLT 153 11/23/2016   GLUCOSE 97 11/23/2016   CHOL 164 11/22/2016   TRIG 117 11/22/2016   HDL 34 (L) 11/22/2016   LDLCALC 107 (H) 11/22/2016   ALT 12 (L) 11/23/2016   AST 18 11/23/2016   NA 137 11/23/2016   K 4.1 11/23/2016   CL 108 11/23/2016   CREATININE 0.97 11/23/2016   BUN 11 11/23/2016   CO2 21 (L) 11/23/2016   TSH 0.634 11/22/2016   INR 0.99 11/21/2016   HGBA1C 5.9 (H) 11/22/2016      renal function stable Up to chair  Left facial droop and right arm,hand weakness   Delight Ovens MD  Beeper [phone] Office [phone] 11/23/2016 6:57 PM

## 2016-11-24 ENCOUNTER — Inpatient Hospital Stay (HOSPITAL_COMMUNITY): Payer: BLUE CROSS/BLUE SHIELD

## 2016-11-24 DIAGNOSIS — G8191 Hemiplegia, unspecified affecting right dominant side: Secondary | ICD-10-CM

## 2016-11-24 LAB — BASIC METABOLIC PANEL
Anion gap: 7 (ref 5–15)
BUN: 12 mg/dL (ref 6–20)
CO2: 23 mmol/L (ref 22–32)
Calcium: 8.4 mg/dL — ABNORMAL LOW (ref 8.9–10.3)
Chloride: 107 mmol/L (ref 101–111)
Creatinine, Ser: 0.97 mg/dL (ref 0.61–1.24)
GFR calc Af Amer: >60 mL/min (ref >60)
GFR calc non Af Amer: >60 mL/min (ref >60)
Glucose, Bld: 100 mg/dL — ABNORMAL HIGH (ref 65–99)
Potassium: 4.1 mmol/L (ref 3.5–5.1)
Sodium: 137 mmol/L (ref 135–145)

## 2016-11-24 LAB — CBC
HCT: 42.1 % (ref 39.0–52.0)
Hemoglobin: 14.1 g/dL (ref 13.0–17.0)
MCH: 29.6 pg (ref 26.0–34.0)
MCHC: 33.5 g/dL (ref 30.0–36.0)
MCV: 88.4 fL (ref 78.0–100.0)
Platelets: 156 10*3/uL (ref 150–400)
RBC: 4.76 MIL/uL (ref 4.22–5.81)
RDW: 13.7 % (ref 11.5–15.5)
WBC: 8.6 10*3/uL (ref 4.0–10.5)

## 2016-11-24 MED ORDER — IOPAMIDOL (ISOVUE-300) INJECTION 61%
INTRAVENOUS | Status: AC
  Filled 2016-11-24: qty 150

## 2016-11-24 MED ORDER — ASPIRIN 81 MG PO CHEW
81.0000 mg | CHEWABLE_TABLET | Freq: Every day | ORAL | Status: DC
  Administered 2016-11-24 – 2016-11-27 (×4): 81 mg via ORAL
  Filled 2016-11-24 (×4): qty 1

## 2016-11-24 MED ORDER — LABETALOL HCL 5 MG/ML IV SOLN
0.5000 mg/min | INTRAVENOUS | Status: DC
  Administered 2016-11-24: 0.5 mg/min via INTRAVENOUS
  Administered 2016-11-24: 2.5 mg/min via INTRAVENOUS
  Administered 2016-11-25: 1 mg/min via INTRAVENOUS
  Administered 2016-11-25: 0.5 mg/min via INTRAVENOUS
  Filled 2016-11-24 (×4): qty 100

## 2016-11-24 MED ORDER — LOSARTAN POTASSIUM 50 MG PO TABS
150.0000 mg | ORAL_TABLET | Freq: Every day | ORAL | Status: DC
  Administered 2016-11-24 – 2016-11-27 (×4): 150 mg via ORAL
  Filled 2016-11-24 (×4): qty 3

## 2016-11-24 MED ORDER — METOPROLOL TARTRATE 50 MG PO TABS
50.0000 mg | ORAL_TABLET | Freq: Two times a day (BID) | ORAL | Status: DC
  Filled 2016-11-24: qty 1

## 2016-11-24 MED ORDER — FENTANYL CITRATE (PF) 100 MCG/2ML IJ SOLN: 50.0000 ug | Freq: Three times a day (TID) | INTRAMUSCULAR | Status: DC | PRN

## 2016-11-24 MED ORDER — AMLODIPINE BESYLATE 5 MG PO TABS
5.0000 mg | ORAL_TABLET | Freq: Every day | ORAL | Status: DC
  Administered 2016-11-24 – 2016-11-27 (×4): 5 mg via ORAL
  Filled 2016-11-24 (×4): qty 1

## 2016-11-24 MED ORDER — METOPROLOL TARTRATE 50 MG PO TABS
75.0000 mg | ORAL_TABLET | Freq: Two times a day (BID) | ORAL | Status: DC
  Administered 2016-11-24 (×2): 75 mg via ORAL
  Filled 2016-11-24 (×2): qty 1

## 2016-11-24 NOTE — Consult Note (Signed)
Physical Medicine and Rehabilitation Consult Reason for Consult: Right sided weakness and slurred speech Referring Physician: Dr. Morton Peters   HPI: Jordan Mcintosh is a 61 y.o. right handed male with history of hypertension, tobacco and polysubstance abuse as well as poor medical compliance. Per chart review patient lives alone in [place]. Independent and still working. Elderly parents in the area of limited assistance. Presented 11/21/2016 with abdominal pain and back pain as well as right facial droop and right-sided weakness with slurred speech. UDS positive opiates. 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 He also had marked hypertension which required emergency intervention with Cardene drip. Cranial CT scan showed age indeterminate hypodensity involving the left lentiform nucleus. MRI of the brain showed acute lacunar infarct extending from the left posterior left corona radiata through to the left external capsule and posterior left lentiform. CT Angio head and neck showed no emergent large vessel occlusion. Echocardiogram with ejection fraction of 60% no wall motion abnormalities. Initial blood pressure control with labetalol drip. Cardiothoracic surgery follow-up for penetrating ulcer hematoma of descending thoracic aorta and no current plan for surgical intervention. Neurology has been consulted for workup of CVA. Physical and occupational therapy evaluations completed with recommendations of physical medicine rehabilitation consult   Review of Systems  Constitutional: Negative for chills and fever.  HENT: Negative for hearing loss.   Eyes: Negative for blurred vision and double vision.  Respiratory: Positive for shortness of breath. Negative for cough.   Cardiovascular: Positive for palpitations and leg swelling. Negative for chest pain.  Gastrointestinal: Positive for constipation. Negative for  nausea.  Genitourinary: Negative for dysuria, flank pain and hematuria.  Musculoskeletal: Positive for joint pain and myalgias.  Skin: Negative for rash.  Neurological: Positive for speech change, focal weakness and weakness. Negative for seizures.  All other systems reviewed and are negative.  Past Medical History:  Diagnosis Date  . Hypertension    History reviewed. No pertinent surgical history. History reviewed. No pertinent family history. Social History:  reports that he has quit smoking. He has never used smokeless tobacco. He reports that he uses drugs, including Marijuana. He reports that he does not drink alcohol. Allergies:  Allergies  Allergen Reactions  . Codeine Nausea Only   No prescriptions prior to admission.    Home: Home Living Family/patient expects to be discharged to:: Private residence Living Arrangements: Alone  Functional History:   Functional Status:  Mobility:          ADL:    Cognition: Cognition Orientation Level: Oriented X4    Blood pressure (!) 146/82, pulse 77, temperature 98 F (36.7 C), temperature source Oral, resp. rate (!) 23, height 5\' 11"  (1.803 m), weight 90.7 kg (200 lb), SpO2 95 %. Physical Exam  HENT:  Facial droop  Eyes: EOM are normal. Left eye exhibits no discharge.  Neck: Normal range of motion. Neck supple. No thyromegaly present.  Cardiovascular: Normal rate and regular rhythm.   Respiratory: Effort normal and breath sounds normal. No respiratory distress.  GI: Soft. Bowel sounds are normal. He exhibits no distension.  Neurological: He is alert.  Speech is a bit dysarthric but intelligible. Follows full commands. Fair awareness of deficits. Right central 7. RUE 3-/5 shoulder,biceps, triceps, 2+ HI. RLE: HF 4-/5 4/5 KE, 4-ADF/PF. LUE and LLE 5/5. Cognitively intact.   Skin: Skin is warm and dry.  Psychiatric: He has a normal mood and affect. His  behavior is normal.    No results found for this or any previous  visit (from the past 24 hour(s)). Ct Angio Head W Or Wo Contrast  Result Date: 11/22/2016 CLINICAL DATA:  Stroke with right arm weakness and diminished right hand grip. EXAM: CT ANGIOGRAPHY HEAD AND NECK TECHNIQUE: Multidetector CT imaging of the head and neck was performed using the standard protocol during bolus administration of intravenous contrast. Multiplanar CT image reconstructions and MIPs were obtained to evaluate the vascular anatomy. Carotid stenosis measurements (when applicable) are obtained utilizing NASCET criteria, using the distal internal carotid diameter as the denominator. CONTRAST:  50 cc Isovue 370 intravenous COMPARISON:  Chest CTA.  Brain MRI from earlier today. FINDINGS: CTA NECK FINDINGS Aortic arch: 3 vessel branching pattern. There is a known penetrating ulcer near the isthmus with intramural hematoma. No evidence of progression since previous CTA. Thickening of the proximal left subclavian artery appears similar to the brachiocephalic and is likely atheromatous rather than hematoma. No involvement of the carotid origins is noted. Right carotid system: Atheromatous type wall thickening of the brachiocephalic artery. Moderate mixed density plaque at the common carotid bifurcation. ICA bulb stenosis measures up to 30%. No ulceration or beading. Left carotid system: Calcified plaque at the common carotid origin. No evidence of intramural hematoma at the origin. Mild to moderate predominately calcified plaque at the common carotid bifurcation. No stenosis, ulceration, or beading. Vertebral arteries: Atheromatous type wall thickening of the proximal subclavian artery. There is a kink in the left subclavian artery just before the vertebral origin approaching 50% stenosis on coronal reformats. Severe atheromatous type narrowing of the left vertebral origin. The codominant right vertebral artery is diffusely patent. No evidence of dissection. Skeleton: No acute or aggressive finding Other  neck: No significant incidental finding. Upper chest: Arterial findings recorded above. No acute pulmonary finding. Review of the MIP images confirms the above findings CTA HEAD FINDINGS Anterior circulation: Symmetric carotid arteries. Diffuse calcification the carotid siphons without flow limiting stenosis. Hypoplastic left A1 segment. Fetal type PCA on the left. No large vessel occlusion. Diffuse atheromatous irregularity, most notably long segment of moderate narrowing in the distal right M1 segment, with advanced stenosis of the right MCA bifurcation, primarily affecting the lower branch. No irregularity or notable narrowing of the left ICA or M1 segment. The infarct pattern on previous brain MRI could be anterior choroidal or left lateral lenticulostriate. Negative for aneurysm. Posterior circulation: Symmetric vertebral arteries. Fetal type PCA on the left. Atheromatous irregularity and mild narrowing of the basilar artery. Mild to moderate atheromatous irregularity of the right more than left PCA. No reversible stenosis. Venous sinuses: Patent Anatomic variants: None other than described above Delayed phase: No abnormal intracranial enhancement Review of the MIP images confirms the above findings IMPRESSION: 1. No emergent large vessel occlusion. 2. 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 No evidence of left common carotid involvement to explain the infarct. 3. Moderate cervical carotid atherosclerosis without flow limiting stenosis. 4. Advanced atheromatous narrowing of the left vertebral artery origin. 5. Intracranial atherosclerosis with moderate distal right M1 segment and advanced right MCA bifurcations stenosis. Electronically Signed   By: Marnee Spring M.D.   On: 11/22/2016 10:43   Ct Angio Neck W Or Wo Contrast  Result Date: 11/22/2016 CLINICAL DATA:  Stroke with right arm weakness and diminished right hand grip. EXAM: CT ANGIOGRAPHY HEAD AND NECK  TECHNIQUE: Multidetector CT imaging of the head and neck was performed using the  standard protocol during bolus administration of intravenous contrast. Multiplanar CT image reconstructions and MIPs were obtained to evaluate the vascular anatomy. Carotid stenosis measurements (when applicable) are obtained utilizing NASCET criteria, using the distal internal carotid diameter as the denominator. CONTRAST:  50 cc Isovue 370 intravenous COMPARISON:  Chest CTA.  Brain MRI from earlier today. FINDINGS: CTA NECK FINDINGS Aortic arch: 3 vessel branching pattern. There is a known penetrating ulcer near the isthmus with intramural hematoma. No evidence of progression since previous CTA. Thickening of the proximal left subclavian artery appears similar to the brachiocephalic and is likely atheromatous rather than hematoma. No involvement of the carotid origins is noted. Right carotid system: Atheromatous type wall thickening of the brachiocephalic artery. Moderate mixed density plaque at the common carotid bifurcation. ICA bulb stenosis measures up to 30%. No ulceration or beading. Left carotid system: Calcified plaque at the common carotid origin. No evidence of intramural hematoma at the origin. Mild to moderate predominately calcified plaque at the common carotid bifurcation. No stenosis, ulceration, or beading. Vertebral arteries: Atheromatous type wall thickening of the proximal subclavian artery. There is a kink in the left subclavian artery just before the vertebral origin approaching 50% stenosis on coronal reformats. Severe atheromatous type narrowing of the left vertebral origin. The codominant right vertebral artery is diffusely patent. No evidence of dissection. Skeleton: No acute or aggressive finding Other neck: No significant incidental finding. Upper chest: Arterial findings recorded above. No acute pulmonary finding. Review of the MIP images confirms the above findings CTA HEAD FINDINGS Anterior circulation:  Symmetric carotid arteries. Diffuse calcification the carotid siphons without flow limiting stenosis. Hypoplastic left A1 segment. Fetal type PCA on the left. No large vessel occlusion. Diffuse atheromatous irregularity, most notably long segment of moderate narrowing in the distal right M1 segment, with advanced stenosis of the right MCA bifurcation, primarily affecting the lower branch. No irregularity or notable narrowing of the left ICA or M1 segment. The infarct pattern on previous brain MRI could be anterior choroidal or left lateral lenticulostriate. Negative for aneurysm. Posterior circulation: Symmetric vertebral arteries. Fetal type PCA on the left. Atheromatous irregularity and mild narrowing of the basilar artery. Mild to moderate atheromatous irregularity of the right more than left PCA. No reversible stenosis. Venous sinuses: Patent Anatomic variants: None other than described above Delayed phase: No abnormal intracranial enhancement Review of the MIP images confirms the above findings IMPRESSION: 1. No emergent large vessel occlusion. 2. 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 No evidence of left common carotid involvement to explain the infarct. 3. Moderate cervical carotid atherosclerosis without flow limiting stenosis. 4. Advanced atheromatous narrowing of the left vertebral artery origin. 5. Intracranial atherosclerosis with moderate distal right M1 segment and advanced right MCA bifurcations stenosis. Electronically Signed   By: Marnee Spring M.D.   On: 11/22/2016 10:43   Mr Laqueta Jean YN Contrast  Result Date: 11/22/2016 CLINICAL DATA:  61 year old male who presented with abdomen and back pain, abnormal descending thoracic aorta, hypertension on labetalol IV. New onset right facial droop right extremity weakness and slurred speech. EXAM: MRI HEAD WITHOUT AND WITH CONTRAST TECHNIQUE: Multiplanar, multiecho pulse sequences of the brain and surrounding  structures were obtained without and with intravenous contrast. CONTRAST:  20mL MULTIHANCE GADOBENATE DIMEGLUMINE 529 MG/ML IV SOLN COMPARISON:  Head CT without contrast 11/21/2016 FINDINGS: Brain: curvilinear restricted diffusion tracking from the posterior left corona radiata into the posterior limb of the left external capsule and posterior left lentiform  nuclei corresponding to the indeterminate hypodensity on head CT yesterday. Associated T2 and FLAIR hyperintensity. No associated hemorrhage or mass effect. Superimposed chronic lacunar infarcts centered about the left caudothalamic groove. Several associated tiny chronic micro hemorrhages in the region. Patchy bilateral cerebral white matter T2 and FLAIR hyperintensity, mostly periventricular. Mild superimposed bilateral basal ganglia T2 heterogeneity appears in part due to perivascular spaces, no right side deep gray matter lacune identified, although there are multiple chronic brainstem lacunar infarcts, affecting the lower midbrain and pons greater on the right. Cerebellum within normal limits. No other chronic cerebral blood products. No cortical encephalomalacia identified. Cavum septum pellucidum, normal variant. No other restricted diffusion. No midline shift, mass effect, evidence of mass lesion, ventriculomegaly, extra-axial collection or acute intracranial hemorrhage. Cervicomedullary junction within normal limits. Anterior pituitary region T2 hyperintense lesion measuring 9 mm is probably a cyst without enhancement. No suprasellar mass or mass effect. No abnormal enhancement identified. Vascular: Major intracranial vascular flow voids are preserved. Skull and upper cervical spine: Negative. Normal bone marrow signal. Sinuses/Orbits: Orbits soft tissues are within normal limits. Trace right maxillary sinus mucosal thickening, other paranasal sinuses are clear. Other: Mild right petrous apex air cell fluid appears inconsequential. Mastoids are clear.  Visible internal auditory structures appear normal. Negative scalp soft tissues. IMPRESSION: 1. CT finding yesterday corresponds to an acute lacunar infarct extending from the left posterior left corona radiata through to the left external capsule and posterior left lentiform. No associated hemorrhage or mass effect. 2. 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 3. 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 Electronically Signed   By: Odessa Fleming M.D.   On: 11/22/2016 09:33   Dg Chest Port 1 View  Result Date: 11/23/2016 CLINICAL DATA:  Shortness of breath and chest pain EXAM: PORTABLE CHEST 1 VIEW COMPARISON:  November 22, 2016 FINDINGS: There is mild atelectatic change in the bases, essentially stable on the right and less apparent on the left. Lungs elsewhere are clear. Heart is normal in size and contour. Pulmonary vascularity appears normal. No adenopathy. No bone lesions. IMPRESSION: Bibasilar atelectatic change, stable on the right and less apparent on the left compared to 1 day prior. No new opacity. Stable cardiac silhouette. Electronically Signed   By: Bretta Bang III M.D.   On: 11/23/2016 07:33   Portable Chest 1 View  Result Date: 11/22/2016 CLINICAL DATA:  Chest pain. EXAM: PORTABLE CHEST 1 VIEW COMPARISON:  One-view chest x-ray 11/21/2016 FINDINGS: The heart size is normal. There slight increase in linear airspace disease at the lung bases bilaterally. Lung volumes are low. There is no edema or effusion. No other significant airspace consolidation is present. IMPRESSION: Increasing linear airspace disease at both lung bases likely reflects atelectasis in either the lower or middle lobe on the right and lower lobe or lingula on the left. Infection is considered less likely. Electronically Signed   By: Marin Roberts M.D.   On: 11/22/2016 08:06     Assessment/Plan: Diagnosis: Left lacunar infarct affecting the corona radiata/external capsule/left lentiform 1. Does the need for close, 24 hr/day medical supervision in concert with the patient's rehab needs make it unreasonable for this patient to be served in a less intensive setting? Yes 2. 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 Due to bladder management, bowel management, safety, skin/wound care, disease management, medication administration, pain management and patient education,  does the patient require 24 hr/day rehab nursing? Yes 4. Does the patient require coordinated care of a physician, rehab nurse, PT (1-2 hrs/day, 5 days/week), OT (1-2 hrs/day, 5 days/week) and SLP (1-2 hrs/day, 5 days/week) to address physical and functional deficits in the context of the above medical diagnosis(es)? Yes Addressing deficits in the following areas: balance, endurance, locomotion, strength, transferring, bowel/bladder control, bathing, dressing, feeding, grooming, toileting, speech and psychosocial support 5. Can the patient actively participate in an intensive therapy program of at least 3 hrs of therapy per day at least 5 days per week? Yes 6. The potential for patient to make measurable gains while on inpatient rehab is excellent 7. Anticipated functional outcomes upon discharge from inpatient rehab are modified independent  with PT, modified independent with OT, modified independent with SLP. 8. Estimated rehab length of stay to reach the above functional goals is: 7-12 days 9. Does the patient have adequate social supports and living environment to accommodate these discharge functional goals? Yes 10. Anticipated D/C setting: Home 11. Anticipated post D/C treatments: HH therapy and Outpatient therapy 12. Overall Rehab/Functional Prognosis: excellent  RECOMMENDATIONS: This patient's condition is appropriate for continued rehabilitative care in  the following setting: CIR Patient has agreed to participate in recommended program. Yes Note that insurance prior authorization may be required for reimbursement for recommended care.  Comment: Rehab Admissions Coordinator to follow up.  Thanks,  Ranelle Oyster, MD, [place] [facility]    Charlton Amor., PA-C 11/24/2016

## 2016-11-24 NOTE — Progress Notes (Signed)
OT Cancellation Note  Patient Details Name: Jordan Mcintosh MRN: [id number] DOB: [birth year]   Cancelled Treatment:    Reason Eval/Treat Not Completed: Patient not medically ready (Pt with elevated BP. Will follow.)  Evern Bio 11/24/2016, 7:57 AM

## 2016-11-24 NOTE — Progress Notes (Signed)
  Subjective: No pain from penetrating ulcer of thoracic aorta with hematoma Still on labetalol drip Increasing BP meds daily Patient may walk with PT, CR-I  Objective: Vital signs in last 24 hours: Temp:  [98 F (36.7 C)-99.1 F (37.3 C)] 98.3 F (36.8 C) (04/25 0735) Pulse Rate:  [25-91] 83 (04/25 0645) Cardiac Rhythm: Normal sinus rhythm (04/25 0400) Resp:  [12-28] 24 (04/25 0645) BP: (93-194)/(54-130) 159/87 (04/25 0645) SpO2:  [92 %-100 %] 97 % (04/25 0645)  Hemodynamic parameters for last 24 hours:  nsr  Intake/Output from previous day: 04/24 0701 - 04/25 0700 In: 1618.4 [P.O.:240; I.V.:1228.4; IV Piggyback:150] Out: 775 [Urine:775] Intake/Output this shift: No intake/output data recorded.       Exam    General- alert and comfortable   Lungs- clear without rales, wheezes   Cor- regular rate and rhythm, no murmur , gallop   Abdomen- soft, non-tender   Extremities - warm, non-tender, minimal edema   Neuro- oriented, appropriate, no focal weakness   Lab Results:  Recent Labs  11/23/16 0223 11/24/16 0539  WBC 9.4 8.6  HGB 13.3 14.1  HCT 40.4 42.1  PLT 153 156   BMET:  Recent Labs  11/23/16 0223 11/24/16 0539  NA 137 137  K 4.1 4.1  CL 108 107  CO2 21* 23  GLUCOSE 97 100*  BUN 11 12  CREATININE 0.97 0.97  CALCIUM 8.4* 8.4*    PT/INR:  Recent Labs  11/21/16 1524  LABPROT 13.1  INR 0.99   ABG No results found for: PHART, HCO3, TCO2, ACIDBASEDEF, O2SAT CBG (last 3)   Recent Labs  11/22/16 0015  GLUCAP 100*    Assessment/Plan: S/P  Mobilize check Ba Swallow for recent problems with swallowing soldids and regurgitation of food Keep in ICU while on iv labetalol  LOS: 3 days    [name] 11/24/2016

## 2016-11-24 NOTE — Evaluation (Signed)
Occupational Therapy Evaluation Patient Details Name: Jordan Mcintosh MRN: [id number] DOB: [birth year] Today's Date: 11/24/2016    History of Present Illness Pt admitted 11/21/16 with extreme hypertension, penatrating thoracic aorta ulcer with hematoma. 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   Clinical Impression   Pt was independent prior to admission. Presents with R side weakness (3/5), impaired balance and poor awareness of deficits/safety.He currently requires 2 person assist for mobility. Pt has excellent rehab potential with intensive rehab and is highly motivated. Will follow acutely.  Follow Up Recommendations  CIR    Equipment Recommendations  Other (comment) (defer to next venue)    Recommendations for Other Services Rehab consult     Precautions / Restrictions Precautions Precautions: Fall Restrictions Weight Bearing Restrictions: No      Mobility Bed Mobility               General bed mobility comments: pt in received in chair  Transfers Overall transfer level: Needs assistance Equipment used: Rolling walker (2 wheeled) Transfers: Sit to/from Stand Sit to Stand: +2 physical assistance;Min assist         General transfer comment: cues for hand placement, min assist to stand and steady    Balance Overall balance assessment: Needs assistance   Sitting balance-Leahy Scale: Fair       Standing balance-Leahy Scale: Poor Standing balance comment: R side lean                           ADL either performed or assessed with clinical judgement   ADL Overall ADL's : Needs assistance/impaired Eating/Feeding: Set up;Sitting Eating/Feeding Details (indicate cue type and reason): assist to open containers, cut food Grooming: Wash/dry face;Sitting;Minimal assistance;Wash/dry hands   Upper Body Bathing: Moderate assistance;Sitting   Lower  Body Bathing: Total assistance;Sit to/from stand   Upper Body Dressing : Sitting;Maximal assistance   Lower Body Dressing: Total assistance;Sit to/from stand               Functional mobility during ADLs: +2 for physical assistance;Moderate assistance;Rolling walker;Cueing for safety (able to maintain R hand on walker with intermittent assist)       Vision Baseline Vision/History: Wears glasses Wears Glasses:  (when working and driving) Patient Visual Report: No change from baseline Additional Comments: has bifocals     Perception     Praxis      Pertinent Vitals/Pain Pain Assessment: No/denies pain     Hand Dominance Right   Extremity/Trunk Assessment Upper Extremity Assessment Upper Extremity Assessment: RUE deficits/detail RUE Deficits / Details: 3/5 throughout RUE Coordination: decreased fine motor;decreased gross motor   Lower Extremity Assessment Lower Extremity Assessment: Defer to PT evaluation   Cervical / Trunk Assessment Cervical / Trunk Assessment: Normal   Communication Communication Communication: Expressive difficulties (slightly slurred)   Cognition Arousal/Alertness: Awake/alert Behavior During Therapy: Anxious;Impulsive Overall Cognitive Status: Impaired/Different from baseline Area of Impairment: Safety/judgement                         Safety/Judgement: Decreased awareness of safety;Decreased awareness of deficits     General Comments: pt concerned that he may be weaker on the R than yesterday   General Comments       Exercises     Shoulder Instructions      Home Living Family/patient expects to be discharged to:: Private residence  Living Arrangements: Alone   Type of Home: Apartment Home Access: Stairs to enter Entrance Stairs-Number of Steps: 2 Entrance Stairs-Rails: None Home Layout: One level     Bathroom Shower/Tub: Producer, television/film/video: Standard     Home Equipment: None           Prior Functioning/Environment Level of Independence: Independent        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, product/process development, drives        OT Problem List: Decreased strength;Decreased activity tolerance;Impaired balance (sitting and/or standing);Decreased coordination;Decreased cognition;Decreased safety awareness;Decreased knowledge of use of DME or AE;Impaired UE functional use      OT Treatment/Interventions: Self-care/ADL training;DME and/or AE instruction;Patient/family education;Balance training;Cognitive remediation/compensation;Therapeutic activities;Neuromuscular education    OT Goals(Current goals can be found in the care plan section) Acute Rehab OT Goals Patient Stated Goal: return to PLOF OT Goal Formulation: With patient Time For Goal Achievement: 12/08/16 Potential to Achieve Goals: Good ADL Goals Pt Will Perform Grooming: with min assist;standing Pt Will Perform Upper Body Bathing: with min assist;sitting Pt Will Perform Upper Body Dressing: with min assist;sitting Pt Will Transfer to Toilet: with min assist;ambulating;bedside commode (over toilet) Pt Will Perform Toileting - Clothing Manipulation and hygiene: with min assist;sit to/from stand Pt/caregiver will Perform Home Exercise Program: Increased strength;Right Upper extremity;With Supervision Additional ADL Goal #1: Pt will use R UE as a functional assist during ADL.  OT Frequency: Min 3X/week   Barriers to D/C:            Co-evaluation              End of Session Equipment Utilized During Treatment: Gait belt;Rolling walker Nurse Communication: Mobility status (cognitive issues)  Activity Tolerance: Patient tolerated treatment well Patient left: in chair;with call bell/phone within reach;with nursing/sitter in room  OT Visit Diagnosis: Unsteadiness on feet (R26.81);Other abnormalities of gait and mobility (R26.89);Hemiplegia and hemiparesis;Other symptoms and signs involving cognitive function;Muscle weakness  (generalized) (M62.81) Hemiplegia - Right/Left: Right Hemiplegia - dominant/non-dominant: Dominant Hemiplegia - caused by: Cerebral infarction                Time: 1410-1517 OT Time Calculation (min): 67 min Charges:  OT General Charges $OT Visit: 1 Procedure OT Evaluation $OT Eval Moderate Complexity: 1 Procedure OT Treatments $Self Care/Home Management : 8-22 mins G-Codes:      Evern Bio 11/24/2016, 3:30 PM  [phone]

## 2016-11-24 NOTE — Progress Notes (Signed)
PT Cancellation Note  Patient Details Name: Jordan Mcintosh MRN: [id number] DOB: 08-Mar-1956   Cancelled Treatment:    Reason Eval/Treat Not Completed: Patient not medically ready. RN deferred as pt's BP skyrocketed into 200s an hour ago. Awaiting for MD to round to verify pt can get up OOB and mobilize again. PT to return as able.   Kemond Amorin M Georgette Helmer 11/24/2016, 7:32 AM   [name], PT, DPT Pager #: [phone] Office #: [phone]

## 2016-11-24 NOTE — Evaluation (Signed)
Physical Therapy Evaluation Patient Details Name: Jordan Mcintosh MRN: [id number] DOB: June 01, 1956 Today's Date: 11/24/2016   History of Present Illness  Pt admitted 11/21/16 with extreme hypertension, penatrating thoracic aorta ulcer with hematoma. 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  Clinical Impression  Pt admitted with above. Pt presenting with R UE weakness > R LE weakness, impaired balance, impaired cognition and impulsivity.Pt to strongly benefit from CIR to achieve maximal functional recovery as pt was working and [employer]. Wife reports she can take him home to his house and she can provide 24/7 assist once he's at a supervision level.    Follow Up Recommendations CIR    Equipment Recommendations   (TBD by next venue)    Recommendations for Other Services       Precautions / Restrictions Precautions Precautions: Fall Restrictions Weight Bearing Restrictions: No      Mobility  Bed Mobility               General bed mobility comments: pt received in chair  Transfers Overall transfer level: Needs assistance Equipment used: Rolling walker (2 wheeled) Transfers: Sit to/from Stand Sit to Stand: +2 physical assistance;Min assist         General transfer comment: cues for hand placement, min assist to stand and steady  Ambulation/Gait Ambulation/Gait assistance: Min assist;Mod assist;+2 physical assistance;+2 safety/equipment Ambulation Distance (Feet): 100 Feet Assistive device: Rolling walker (2 wheeled) Gait Pattern/deviations: Step-through pattern;Decreased stride length;Decreased step length - left;Decreased stance time - right;Narrow base of support Gait velocity: slow Gait velocity interpretation: Below normal speed for age/gender General Gait Details: max v/c's to increased base of support, pt with R lateral lean requiring tactile cues to obtain  minline, minA for walker management, mild R knee buckling with onset of fatigue  Stairs            Wheelchair Mobility    Modified Rankin (Stroke Patients Only) Modified Rankin (Stroke Patients Only) Pre-Morbid Rankin Score: No symptoms Modified Rankin: Moderately severe disability     Balance Overall balance assessment: Needs assistance   Sitting balance-Leahy Scale: Fair     Standing balance support: Bilateral upper extremity supported Standing balance-Leahy Scale: Poor Standing balance comment: R side lean                             Pertinent Vitals/Pain Pain Assessment: No/denies pain    Home Living Family/patient expects to be discharged to:: Private residence Living Arrangements: Alone   Type of Home: Apartment Home Access: Stairs to enter Entrance Stairs-Rails: None Entrance Stairs-Number of Steps: 2 Home Layout: One level Home Equipment: None      Prior Function Level of Independence: Independent         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 except wrist and hand at 2+/5 RUE Coordination: decreased fine motor;decreased gross motor    Lower Extremity Assessment Lower Extremity Assessment: RLE deficits/detail RLE Deficits / Details: grossly 4/5    Cervical / Trunk Assessment Cervical / Trunk Assessment: Normal  Communication   Communication: Expressive difficulties (slightly slurred)  Cognition Arousal/Alertness: Awake/alert Behavior During Therapy: Anxious;Impulsive Overall Cognitive Status: Impaired/Different from baseline Area of Impairment: Safety/judgement  Safety/Judgement: Decreased awareness of safety;Decreased awareness of deficits     General Comments: pt very particular       General Comments      Exercises     Assessment/Plan    PT Assessment Patient needs continued PT services  PT Problem List Decreased strength;Decreased activity tolerance;Decreased balance;Decreased mobility;Decreased coordination;Decreased cognition;Decreased knowledge of use of DME;Decreased safety awareness;Impaired tone       PT Treatment Interventions DME instruction;Gait training;Stair training;Functional mobility training;Therapeutic activities;Therapeutic exercise;Balance training;Neuromuscular re-education;Patient/family education;Cognitive remediation    PT Goals (Current goals can be found in the Care Plan section)  Acute Rehab PT Goals Patient Stated Goal: return to PLOF PT Goal Formulation: With patient Time For Goal Achievement: 12/01/16 Potential to Achieve Goals: Good    Frequency Min 4X/week   Barriers to discharge        Co-evaluation PT/OT/SLP Co-Evaluation/Treatment: Yes Reason for Co-Treatment: Complexity of the patient's impairments (multi-system involvement) PT goals addressed during session: Mobility/safety with mobility         End of Session Equipment Utilized During Treatment: Gait belt Activity Tolerance: Patient tolerated treatment well Patient left: in chair;with call bell/phone within reach;with chair alarm set;with nursing/sitter in room Nurse Communication: Mobility status PT Visit Diagnosis: Hemiplegia and hemiparesis Hemiplegia - Right/Left: Right Hemiplegia - dominant/non-dominant: Dominant Hemiplegia - caused by:  (HTN)    Time: 9604-5409 PT Time Calculation (min) (ACUTE ONLY): 18 min   Charges:   PT Evaluation $PT Eval Moderate Complexity: 1 Procedure     PT G CodesLewis Mcintosh, PT, DPT Pager #: [phone] Office #: [phone]   Jordan Mcintosh Jordan Mcintosh 11/24/2016, 5:04 PM

## 2016-11-24 NOTE — Progress Notes (Signed)
[facility] BRIEF SICU PROGRESS NOTE  Stable day BP stable now off labetalol drip  Plan: Continue current plan  Purcell Nails, MD 11/24/2016 6:02 PM

## 2016-11-25 ENCOUNTER — Encounter (HOSPITAL_COMMUNITY): Payer: Self-pay | Admitting: Physical Medicine and Rehabilitation

## 2016-11-25 DIAGNOSIS — I7101 Dissection of thoracic aorta: Secondary | ICD-10-CM

## 2016-11-25 MED ORDER — FUROSEMIDE 40 MG PO TABS
40.0000 mg | ORAL_TABLET | Freq: Every day | ORAL | Status: DC
  Administered 2016-11-26 – 2016-11-27 (×2): 40 mg via ORAL
  Filled 2016-11-25 (×2): qty 1

## 2016-11-25 MED ORDER — METOPROLOL TARTRATE 50 MG PO TABS
100.0000 mg | ORAL_TABLET | Freq: Two times a day (BID) | ORAL | Status: DC
  Administered 2016-11-25 – 2016-11-27 (×5): 100 mg via ORAL
  Filled 2016-11-25 (×5): qty 2

## 2016-11-25 MED ORDER — FUROSEMIDE 10 MG/ML IJ SOLN
40.0000 mg | Freq: Once | INTRAMUSCULAR | Status: AC
  Administered 2016-11-25: 40 mg via INTRAVENOUS
  Filled 2016-11-25: qty 4

## 2016-11-25 MED ORDER — SODIUM CHLORIDE 0.9 % IV SOLN
INTRAVENOUS | Status: DC
  Administered 2016-11-25: 09:00:00 via INTRAVENOUS

## 2016-11-25 NOTE — Progress Notes (Signed)
I met with pt at bedside to discuss a possible inpt rehab admission., He is in agreement. I will begin insurance approval with [employer]. I will follow up tomorrow. [phone]

## 2016-11-25 NOTE — Progress Notes (Signed)
I have [employer] approval to admit pt to inpt rehab unit when medically ready. Noted Hypertension issues. I will follow up tomorrow to clarify if pt would be medically ready to admit Friday or Saturday. Please call me with any questions. [phone]

## 2016-11-25 NOTE — Progress Notes (Signed)
  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 Right upper extremity weakness stable  Chest x-ray shows no left pleural effusion Echocardiogram shows normal LV function with normal aortic valve function. Thoracic aortic diameter is normal Swallow study for history of solid food regurgitation shows no anatomic stricture but with evidence of functional dysmotility  Patient walking in hallway with cyst 2 CIR evaluation in progress Follow-up CT scan of aorta tomorrow  Objective: Vital signs in last 24 hours: Temp:  [97.3 F (36.3 C)-98.4 F (36.9 C)] 98.4 F (36.9 C) (04/26 0400) Pulse Rate:  [71-86] 78 (04/26 1500) Cardiac Rhythm: Normal sinus rhythm (04/26 1200) Resp:  [15-30] 25 (04/26 1500) BP: (119-177)/(66-93) 128/66 (04/26 1500) SpO2:  [92 %-99 %] 98 % (04/26 1500) Weight:  [213 lb (96.6 kg)] 213 lb (96.6 kg) (04/26 0500)  Hemodynamic parameters for last 24 hours:    Intake/Output from previous day: 04/25 0701 - 04/26 0700 In: 1591.8 [P.O.:240; I.V.:1251.8; IV Piggyback:100] Out: 2425 [Urine:2425] Intake/Output this shift: Total I/O In: 393.5 [P.O.:360; I.V.:33.5] Out: 775 [Urine:775]       Exam    General- alert and comfortable   Lungs- clear without rales, wheezes   Cor- regular rate and rhythm, no murmur , gallop   Abdomen- soft, non-tender   Extremities - warm, non-tender, minimal edema   Neuro- oriented, appropriate, no focal weakness   Lab Results:  Recent Labs  11/23/16 0223 11/24/16 0539  WBC 9.4 8.6  HGB 13.3 14.1  HCT 40.4 42.1  PLT 153 156   BMET:  Recent Labs  11/23/16 0223 11/24/16 0539  NA 137 137  K 4.1 4.1  CL 108  107  CO2 21* 23  GLUCOSE 97 100*  BUN 11 12  CREATININE 0.97 0.97  CALCIUM 8.4* 8.4*    PT/INR: No results for input(s): LABPROT, INR in the last 72 hours. ABG No results found for: PHART, HCO3, TCO2, ACIDBASEDEF, O2SAT CBG (last 3)  No results for input(s): GLUCAP in the last 72 hours.  Assessment/Plan: S/P  Mobilize Control hypertension Physical therapy for left parietal stroke Keep in ICU until patient off IV labetalol with safe blood pressure  LOS: 4 days    [name] 11/25/2016

## 2016-11-25 NOTE — Progress Notes (Signed)
Physical Therapy Treatment Patient Details Name: Jordan Mcintosh MRN: [id number] DOB: 16-Dec-1955 Today's Date: 11/25/2016    History of Present Illness Pt admitted 11/21/16 with extreme hypertension, penatrating thoracic aorta ulcer with hematoma. 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    PT Comments    Patient progressing with ambulation distance over two trials with standing rest for BP measurement and recovery.  Patient leaning R with standing tasks requiring mod A for balance and note still needs cues for proper positioning of R UE due to weakness and inattention.  Patient remains appropriate for CIR level rehab.    Follow Up Recommendations  CIR     Equipment Recommendations  Rolling walker with 5" wheels    Recommendations for Other Services       Precautions / Restrictions Precautions Precautions: Fall    Mobility  Bed Mobility Overal bed mobility: Needs Assistance Bed Mobility: Supine to Sit     Supine to sit: Min assist     General bed mobility comments: assist for lifting trunk and managing lines  Transfers Overall transfer level: Needs assistance Equipment used: Rolling walker (2 wheeled) Transfers: Sit to/from Stand Sit to Stand: Min assist         General transfer comment: cues for safety, assist for balance  Ambulation/Gait Ambulation/Gait assistance: Min assist;Mod assist Ambulation Distance (Feet): 80 Feet (x 2) Assistive device: Rolling walker (2 wheeled) Gait Pattern/deviations: Step-through pattern;Decreased stride length;Wide base of support;Drifts right/left;Decreased weight shift to left Gait velocity: slow Gait velocity interpretation: <1.8 ft/sec, indicative of risk for recurrent falls General Gait Details: assist for balance, leans to R and esp with turns needing increased assist for safety, assist for R hand on walker   Stairs             Wheelchair Mobility    Modified Rankin (Stroke Patients Only) Modified Rankin (Stroke Patients Only) Pre-Morbid Rankin Score: No symptoms Modified Rankin: Moderately severe disability     Balance Overall balance assessment: Needs assistance   Sitting balance-Leahy Scale: Fair Sitting balance - Comments: able to sit EOB with S for using urinal, but noted posterior lean   Standing balance support: Single extremity supported;During functional activity Standing balance-Leahy Scale: Poor Standing balance comment: mod A for balance due to R lean while washing hands at sink                            Cognition Arousal/Alertness: Awake/alert Behavior During Therapy: Impulsive Overall Cognitive Status: Impaired/Different from baseline Area of Impairment: Safety/judgement                         Safety/Judgement: Decreased awareness of deficits            Exercises      General Comments        Pertinent Vitals/Pain Pain Assessment: No/denies pain    Home Living                      Prior Function            PT Goals (current goals can now be found in the care plan section) Progress towards PT goals: Progressing toward goals    Frequency    Min 4X/week      PT Plan Current plan remains appropriate    Co-evaluation  End of Session Equipment Utilized During Treatment: Gait belt Activity Tolerance: Patient limited by fatigue Patient left: in chair;with chair alarm set;with call bell/phone within reach Nurse Communication: Mobility status PT Visit Diagnosis: Ataxic gait (R26.0);Hemiplegia and hemiparesis Hemiplegia - Right/Left: Right Hemiplegia - dominant/non-dominant: Dominant Hemiplegia - caused by: Cerebral infarction     Time: 1610-9604 PT Time Calculation (min) (ACUTE ONLY): 20 min  Charges:  $Gait Training: 8-22 mins                    G CodesSheran Mcintosh,  Jordan Mcintosh [phone] 11/25/2016    Jordan Mcintosh 11/25/2016, 12:26 PM

## 2016-11-25 NOTE — Clinical Social Work Note (Signed)
Clinical Social Work Assessment  Patient Details  Name: Jordan Mcintosh MRN: [id number] Date of Birth: May 23, 1956  Date of referral:  11/25/16               Reason for consult:  Facility Placement                Permission sought to share information with:  Facility Art therapist granted to share information::  Yes, Verbal Permission Granted  Name::        Agency::  SNF  Relationship::     Contact Information:     Housing/Transportation Living arrangements for the past 2 months:  Apartment Source of Information:  Patient Patient Interpreter Needed:  None Criminal Activity/Legal Involvement Pertinent to Current Situation/Hospitalization:  No - Comment as needed Significant Relationships:  Other(Comment) (wife who patient is separated from) Lives with:  Self Do you feel safe going back to the place where you live?  No Need for family participation in patient care:  No (Coment)  Care giving concerns:  Pt lives in apartment alone.  Patient with large amount of impairment following stroke and would not be able to care for self at home at this time.  Pt has good support from wife who he is separated from but she would not be capable of caring for pt with current level of needs.   Social Worker assessment / plan:  CSW met with pt to discuss PT recommendation for rehab when medically stable for DC.  CSW explained CIR vs SNF and possible barriers to CIR admission (insurance, availability).  CSW explained SNF and alternative option if CIR is not available at DC.  Employment status:  Therapist, music:  Managed Care PT Recommendations:  Inpatient Rehab Consult Information / Referral to community resources:  [place]  Patient/Family's Response to care:  Pt is agreeable to having referral faxed out to SNF but is not sure about going- CSW explained that sending out SNF referral is not a commitment to going but would allow [place] to have options in case  patient cannot go to CIR and is not strong enough to go live with his ex-wife.  Patient/Family's Understanding of and Emotional Response to Diagnosis, Current Treatment, and Prognosis:  Pt expressed good understanding of his current condition and recommendations- hopeful that he can go to CIR or will be safe to return home with support from ex-wife.  Emotional Assessment Appearance:  Appears stated age Attitude/Demeanor/Rapport:    Affect (typically observed):  Appropriate, Accepting Orientation:  Oriented to Self, Oriented to Place, Oriented to  Time, Oriented to Situation Alcohol / Substance use:  Not Applicable Psych involvement (Current and /or in the community):  No (Comment)  Discharge Needs  Concerns to be addressed:  Care Coordination Readmission within the last 30 days:  No Current discharge risk:  Physical Impairment Barriers to Discharge:  Continued Medical Work up   Jordan Ny, LCSW 11/25/2016, 2:13 PM

## 2016-11-25 NOTE — Plan of Care (Signed)
Problem: Physical Regulation: Goal: Ability to maintain clinical measurements within normal limits will improve Outcome: Progressing Came off labetalol drip today  Problem: Education: Goal: Knowledge of disease or condition will improve Outcome: Progressing Patient educated on need to begin using right side to preserve function  Problem: Nutrition: Goal: Risk of aspiration will decrease Outcome: Adequate for Discharge Passed swallow evaluation; recommendations reinforced

## 2016-11-25 NOTE — [place] FL2 (Signed)
[place] MEDICAID FL2 LEVEL OF CARE SCREENING TOOL     IDENTIFICATION  Patient Name: Jordan Mcintosh Birthdate: 18-Feb-1956 Sex: male Admission Date (Current Location): 11/21/2016  [facility] and [place] Number:  [employer] and Address:  [facility] [place], [address], [place], [place] [address]      Provider Number: [id number]  Attending Physician Name and Address:  Kerin Perna, MD  Relative Name and Phone Number:       Current Level of Care: Hospital Recommended Level of Care: Skilled Nursing Facility Prior Approval Number:    Date Approved/Denied:   PASRR Number: [id number]  Discharge Plan: SNF    Current Diagnoses: Patient Active Problem List   Diagnosis Date Noted  . Cerebral thrombosis with cerebral infarction 11/22/2016  . Intramural aortic hematoma (HCC) 11/21/2016    Orientation RESPIRATION BLADDER Height & Weight     Self, Time, Situation, Place  Normal Continent Weight: 213 lb (96.6 kg) Height:   (180.3 cm)  BEHAVIORAL SYMPTOMS/MOOD NEUROLOGICAL BOWEL NUTRITION STATUS      Continent Diet (cardiac)  AMBULATORY STATUS COMMUNICATION OF NEEDS Skin   Extensive Assist Verbally Normal                       Personal Care Assistance Level of Assistance  Bathing, Dressing Bathing Assistance: Maximum assistance   Dressing Assistance: Maximum assistance     Functional Limitations Info  (P) Hearing (impaired hearing)   Hearing Info: Impaired      SPECIAL CARE FACTORS FREQUENCY  PT (By licensed PT), OT (By licensed OT)     PT Frequency: 5/wk OT Frequency: 5/wk            Contractures      Additional Factors Info  Code Status, Allergies Code Status Info: FULL Allergies Info: codeine           Current Medications (11/25/2016):  This is the current hospital active medication list Current Facility-Administered Medications  Medication Dose Route Frequency Provider Last Rate Last Dose  . 0.9 %   sodium chloride infusion   Intravenous Continuous Kerin Perna, MD 10 mL/hr at 11/25/16 [phone]    . acetaminophen (TYLENOL) tablet 650 mg  650 mg Oral Q6H PRN Kerin Perna, MD   650 mg at 11/23/16 1206   Or  . acetaminophen (TYLENOL) suppository 650 mg  650 mg Rectal Q6H PRN Kerin Perna, MD      . amLODipine (NORVASC) tablet 5 mg  5 mg Oral Daily Kerin Perna, MD   5 mg at 11/25/16 0851  . aspirin chewable tablet 81 mg  81 mg Oral Daily Kerin Perna, MD   81 mg at 11/25/16 0851  . atorvastatin (LIPITOR) tablet 40 mg  40 mg Oral q1800 Marvel Plan, MD   40 mg at 11/24/16 1745  . fentaNYL (SUBLIMAZE) injection 50 mcg  50 mcg Intravenous Q8H PRN Kerin Perna, MD      . Melene Muller ON 11/26/2016] furosemide (LASIX) tablet 40 mg  40 mg Oral Daily Kerin Perna, MD      . gi cocktail (Maalox,Lidocaine,Donnatal)  30 mL Oral Once Gerhard Munch, MD   Stopped at 11/21/16 1200  . labetalol (NORMODYNE,TRANDATE) 500 mg in dextrose 5 % 125 mL (4 mg/mL) infusion  0.5-3 mg/min Intravenous Titrated Delight Ovens, MD   Stopped at 11/25/16 1015  . labetalol (NORMODYNE,TRANDATE) injection 10 mg  10 mg Intravenous Q2H PRN  Kerin Perna, MD   10 mg at 11/25/16 0630  . losartan (COZAAR) tablet 150 mg  150 mg Oral Daily Kerin Perna, MD   150 mg at 11/25/16 0850  . magnesium hydroxide (MILK OF MAGNESIA) suspension 30 mL  30 mL Oral Daily PRN Kerin Perna, MD      . metoprolol (LOPRESSOR) tablet 100 mg  100 mg Oral BID Kerin Perna, MD   100 mg at 11/25/16 0851  . multivitamin with minerals tablet 1 tablet  1 tablet Oral Daily Kerin Perna, MD   1 tablet at 11/25/16 0850  . ondansetron (ZOFRAN) tablet 4 mg  4 mg Oral Q6H PRN Kerin Perna, MD       Or  . ondansetron [facility]) injection 4 mg  4 mg Intravenous Q6H PRN Kerin Perna, MD      . pantoprazole (PROTONIX) EC tablet 40 mg  40 mg Oral Q1200 Kerin Perna, MD   40 mg at 11/24/16 1055  . simethicone (MYLICON) chewable tablet 80 mg   80 mg Oral QID Kerin Perna, MD   80 mg at 11/25/16 0851  . sorbitol 70 % solution 30 mL  30 mL Oral Daily PRN Kerin Perna, MD      . traMADol Janean Sark) tablet 50 mg  50 mg Oral Q6H PRN Kerin Perna, MD         Discharge Medications: Please see discharge summary for a list of discharge medications.  Relevant Imaging Results:  Relevant Lab Results:   Additional Information SS#: [id number]  Burna Sis, LCSW

## 2016-11-26 ENCOUNTER — Inpatient Hospital Stay (HOSPITAL_COMMUNITY): Payer: BLUE CROSS/BLUE SHIELD

## 2016-11-26 ENCOUNTER — Encounter (HOSPITAL_COMMUNITY): Payer: Self-pay | Admitting: Physical Medicine and Rehabilitation

## 2016-11-26 LAB — CBC
HCT: 43.5 % (ref 39.0–52.0)
Hemoglobin: 14.6 g/dL (ref 13.0–17.0)
MCH: 29.7 pg (ref 26.0–34.0)
MCHC: 33.6 g/dL (ref 30.0–36.0)
MCV: 88.4 fL (ref 78.0–100.0)
Platelets: 178 10*3/uL (ref 150–400)
RBC: 4.92 MIL/uL (ref 4.22–5.81)
RDW: 13.6 % (ref 11.5–15.5)
WBC: 8.8 10*3/uL (ref 4.0–10.5)

## 2016-11-26 LAB — BASIC METABOLIC PANEL
Anion gap: 9 (ref 5–15)
BUN: 17 mg/dL (ref 6–20)
CO2: 24 mmol/L (ref 22–32)
Calcium: 8.9 mg/dL (ref 8.9–10.3)
Chloride: 104 mmol/L (ref 101–111)
Creatinine, Ser: 1.06 mg/dL (ref 0.61–1.24)
GFR calc Af Amer: >60 mL/min (ref >60)
GFR calc non Af Amer: >60 mL/min (ref >60)
Glucose, Bld: 102 mg/dL — ABNORMAL HIGH (ref 65–99)
Potassium: 4.1 mmol/L (ref 3.5–5.1)
Sodium: 137 mmol/L (ref 135–145)

## 2016-11-26 MED ORDER — IOPAMIDOL (ISOVUE-370) INJECTION 76%
INTRAVENOUS | Status: AC
  Administered 2016-11-26: 13:00:00
  Administered 2016-11-26: 100 mL
  Filled 2016-11-26: qty 100

## 2016-11-26 MED ORDER — CLONIDINE HCL 0.2 MG/24HR TD PTWK
0.2000 mg | MEDICATED_PATCH | TRANSDERMAL | Status: DC
  Administered 2016-11-26: 0.2 mg via TRANSDERMAL
  Filled 2016-11-26: qty 1

## 2016-11-26 MED ORDER — PANTOPRAZOLE SODIUM 40 MG PO TBEC
40.0000 mg | DELAYED_RELEASE_TABLET | Freq: Two times a day (BID) | ORAL | Status: DC
  Administered 2016-11-26 – 2016-11-27 (×3): 40 mg via ORAL
  Filled 2016-11-26 (×3): qty 1

## 2016-11-26 NOTE — Progress Notes (Signed)
  Subjective: BP controlled, pt ambulating, CTA shows  descending aortic IMH better Will transfer to CIR SAT- patient medically stable   Objective: Vital signs in last 24 hours: Temp:  [97.7 F (36.5 C)-98.9 F (37.2 C)] 98 F (36.7 C) (04/27 1100) Pulse Rate:  [70-84] 74 (04/27 1200) Cardiac Rhythm: Normal sinus rhythm (04/27 0815) Resp:  [9-28] 22 (04/27 1200) BP: (109-167)/(58-129) 114/70 (04/27 1200) SpO2:  [92 %-100 %] 97 % (04/27 1200)  Hemodynamic parameters for last 24 hours:  nsr  Intake/Output from previous day: 04/26 0701 - 04/27 0700 In: 975.7 [P.O.:840; I.V.:135.7] Out: 2400 [Urine:2400] Intake/Output this shift: Total I/O In: 52 [I.V.:52] Out: 850 [Urine:850]      Exam    General- alert and comfortable   Lungs- clear without rales, wheezes   Cor- regular rate and rhythm, no murmur , gallop   Abdomen- soft, non-tender   Extremities - warm, non-tender, minimal edema   Neuro- oriented, appropriate, RUE focal weakness is stable    Lab Results:  Recent Labs  11/24/16 0539 11/26/16 0648  WBC 8.6 8.8  HGB 14.1 14.6  HCT 42.1 43.5  PLT 156 178   BMET:  Recent Labs  11/24/16 0539 11/26/16 0648  NA 137 137  K 4.1 4.1  CL 107 104  CO2 23 24  GLUCOSE 100* 102*  BUN 12 17  CREATININE 0.97 1.06  CALCIUM 8.4* 8.9    PT/INR: No results for input(s): LABPROT, INR in the last 72 hours. ABG No results found for: PHART, HCO3, TCO2, ACIDBASEDEF, O2SAT CBG (last 3)  No results for input(s): GLUCAP in the last 72 hours.  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

## 2016-11-26 NOTE — Progress Notes (Signed)
I met with pt at bedside and then discussed with RN, Nicole. Pt began oral BP meds today and is for a CT scan and transfer to floor today. MD is planning admit to inpt rehab/CIR Saturday. Pt is in agreement. I will make the arrangements to admit on Saturday. [phone] 

## 2016-11-26 NOTE — Discharge Summary (Signed)
Physician Discharge Summary  Patient ID: Jordan Mcintosh MRN: [id number] DOB/AGE: 04-Nov-1955 61 y.o.  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 On the date of admission when he awoke in the morning he had pain in his mid back which began to radiate to the upper mid abdomen. He called EMS and was found to have mild lightheadedness but no syncope, no dyspnea, no weakness in any extremity, no nausea, or vomiting. He was noted by EMS to be diaphoretic and appeared weak and systolic blood pressure was noted to be 280. He is noted to have a history of hypertension but discontinued his medications approximately 9 months ago. In the emergency department a thoracic aortic CT angiogram was done which revealed a penetrating ulcer contained leak with periaortic hematoma. Cardiothoracic surgical consultation was obtained with Dr. Maren Beach and he was felt to require admission for further observation, pain control and aggressive blood pressure control. He was initially started on a  labetalol infusion. On the evening of admission the patient developed right arm weakness as well as slight right facial droop and right arm drift any was seen promptly by neurology as a code stroke. Findings were felt to be consistent with a left cerebral infarction. Initial CT of the head was negative. 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 CTA of the head and neck revealed advanced atheromatous [place] of the L-VA at origin, advanced right MCA bifurcation stenosis and no evidence of progression of intramural hematoma. These labetalol has been weaned off and he has been started on an aggressive regimen of multiple antihypertensive medicines. He is also felt to be a candidate for CIR for further rehabilitation as an inpatient for recovery from his stroke. Tentatively he is felt to be stable for this transfer on tomorrow's date.  Consults: neurology  Significant Diagnostic Studies: angiography: chest CTA Echocardiogram Head CT Brain MRI   Treatments: cardiac meds: multiple antihypertensives/ stroke management per the neurology team.  Disposition: CIR  The patient has been discharged on:   1.Beta Blocker:  Yes [  x ]                              No   [   ]  If No, reason:  2.Ace Inhibitor/ARB: Yes [x   ]                                     No  [    ]                                     If No, reason:  3.Statin:   Yes x]                  No  [   ]                  If No, reason:  4.Ecasa:  Yes  [x   ]                  No   [   ]                  If No, reason:      Allergies as of 11/27/2016      Reactions   Codeine Nausea Only      Medication List    TAKE these medications   acetaminophen 325 MG tablet Commonly known as:  TYLENOL Take 2  tablets (650 mg total) by mouth every 6 (six) hours as needed for mild pain (or Fever >/= 101).   amLODipine 5 MG tablet Commonly known as:  NORVASC Take 1 tablet (5 mg total) by mouth daily. Start taking on:  11/28/2016   aspirin 81 MG chewable tablet Chew 1 tablet (81 mg total) by mouth daily. Start taking on:  11/28/2016   atorvastatin 40 MG tablet Commonly known as:  LIPITOR Take 1 tablet (40 mg total) by mouth daily at 6 PM.   cloNIDine 0.2 mg/24hr patch Commonly known as:  CATAPRES - Dosed in mg/24 hr Place 1 patch (0.2 mg total) onto the skin once a week. Start taking on:  12/03/2016   furosemide 40 MG tablet Commonly known as:  LASIX Take 1 tablet (40 mg total) by mouth daily. Start taking on:  11/28/2016   losartan 50 MG tablet Commonly known as:  COZAAR Take 3 tablets (150 mg total) by mouth daily. Start taking on:  11/28/2016   metoprolol 100 MG tablet Commonly known as:  LOPRESSOR Take 1 tablet (100 mg total) by mouth 2 (two) times daily.   multivitamin with minerals Tabs tablet Take 1 tablet by mouth daily. Start taking on:  11/28/2016   pantoprazole 40 MG tablet Commonly known as:  PROTONIX Take 1 tablet (40 mg total) by mouth 2 (two) times daily.   simethicone 80 MG chewable tablet Commonly known as:  MYLICON Chew 1 tablet (80 mg total) by mouth 4 (four) times daily.   traMADol 50 MG tablet Commonly known as:  ULTRAM Take 1 tablet (50 mg total) by mouth every 6 (six) hours as needed for moderate pain.      Follow-up Information    Xu,Jindong, MD. Schedule an appointment as soon as possible for a visit in 6 week(s).   Specialty:  Neurology Contact information: [address] [place] [address] [phone]        Kathlee Nations Maudie Flakes III, MD Follow up.   Specialty:  Cardiothoracic Surgery Why:  arrange for 2 week appt following discharge from  the hospital Contact information: [address] [place]  [address] [phone]           Signed: Lowella Dandy 11/27/2016, 3:53 PM

## 2016-11-26 NOTE — Progress Notes (Signed)
Patient ID: Jordan Mcintosh, male   DOB: 1956/03/05, 62 y.o.   MRN: [id number]  SICU Evening Rounds:  Hemodynamically stable off IV labetalol  Urine output good  Planning to transfer to CIR tomorrow.

## 2016-11-26 NOTE — Discharge Instructions (Signed)
Ischemic Stroke An ischemic stroke is the sudden death of brain tissue. Blood carries oxygen to all areas of the body. This type of stroke happens when your blood does not flow to your brain like normal. Your brain cannot get the oxygen it needs. This is an emergency. It must be treated right away. Symptoms of a stroke usually happen all of a sudden. You may notice them when you wake up. They can include:  Weakness or loss of feeling in your face, arm, or leg. This often happens on one side of the body.  Trouble walking.  Trouble moving your arms or legs.  Loss of balance or coordination.  Feeling confused.  Trouble talking or understanding what people are saying.  Slurred speech.  Trouble seeing.  Seeing two of one object (double vision).  Feeling dizzy.  Feeling sick to your stomach (nauseous) and throwing up (vomiting).  A very bad headache for no reason. Get help as soon as any of these problems start. This is important. Some treatments work better if they are given right away. These include:  Aspirin.  Medicines to control blood pressure.  A shot (injection) of medicine to break up the blood clot.  Treatments given in the blood vessel (artery) to take out the clot or break it up. Other treatments may include:  Oxygen.  Fluids given through an IV tube.  Medicines to thin out your blood.  Procedures to help your blood flow better. What increases the risk? Certain things may make you more likely to have a stroke. Some of these are things that you can change, such as:  Being very overweight (obesity).  Smoking.  Taking birth control pills.  Not being active.  Drinking too much alcohol.  Using drugs. Other risk factors include:  High blood pressure.  High cholesterol.  Diabetes.  Heart disease.  Being African American, Native American, Hispanic, or [place] Native.  Being over age 60.  Family history of stroke.  Having had blood clots, stroke,  or warning stroke (transient ischemic attack, TIA) in the past.  Sickle cell disease.  Being a woman with a history of high blood pressure in pregnancy (preeclampsia).  Migraine headache.  Sleep apnea.  Having an irregular heartbeat (atrial fibrillation).  Long-term (chronic) diseases that cause soreness and swelling (inflammation).  Disorders that affect how your blood clots. Follow these instructions at home: Medicines  Take over-the-counter and prescription medicines only as told by your doctor.  If you were told to take aspirin or another medicine to thin your blood, take it exactly as told by your doctor.  Taking too much of the medicine can cause bleeding.  If you do not take enough, it may not work as well.  Know the side effects of your medicines. If you are taking a blood thinner, make sure you:  Hold pressure over any cuts for longer than usual.  Tell your dentist and other doctors that you take this medicine.  Avoid activities that may cause damage or injury to your body. Eating and drinking  Follow instructions from your doctor about what you cannot eat or drink.  Eat healthy foods.  If you have trouble with swallowing, do these things to avoid choking:  Take small bites when eating.  Eat foods that are soft or pureed. Safety  Follow instructions from your health care team about physical activity.  Use a walker or cane as told by your doctor.  Keep your home safe so you do not fall. This   not fall. This may include:  Having experts look at your home to make sure it is safe.  Putting grab bars in the bedroom and bathroom.  Using raised toilets.  Putting a seat in the shower. General instructions   Do not use any tobacco products.  Examples of these are cigarettes, chewing tobacco, and e-cigarettes.  If you need help quitting, ask your doctor.  Limit how much alcohol you drink. This means no more than 1 drink a day for nonpregnant women and 2 drinks a  day for men. One drink equals 12 oz of beer, 5 oz of wine, or 1 oz of hard liquor.  If you need help to stop using drugs or alcohol, ask your doctor to refer you to a program or specialist.  Stay active. Exercise as told by your doctor.  Keep all follow-up visits as told by your doctor. This is important. Get help right away if:  You suddenly:  Have weakness or loss of feeling in your face, arm, or leg.  Feel confused.  Have trouble talking or understanding what people are saying.  Have trouble seeing.  Have trouble walking.  Have trouble moving your arms or legs.  Feel dizzy.  Lose your balance or coordination.  Have a very bad headache and you do not know why.  You pass out (lose consciousness) or almost pass out.  You have jerky movements that you cannot control (seizure). These symptoms may be an emergency. Do not wait to see if the symptoms will go away. Get medical help right away. Call your local emergency services (911 in the [place] Do not drive yourself to the hospital. This information is not intended to replace advice given to you by your health care provider. Make sure you discuss any questions you have with your health care provider. Document Released: 07/08/2011 Document Revised: 12/30/2015 Document Reviewed: 10/15/2015 Elsevier Interactive Patient Education  2017 [employer] Hypertension Hypertension, commonly called high blood pressure, is when the force of blood pumping through the arteries is too strong. The arteries are the blood vessels that carry blood from the heart throughout the body. Hypertension forces the heart to work harder to pump blood and may cause arteries to become narrow or stiff. Having untreated or uncontrolled hypertension can cause heart attacks, strokes, kidney disease, and other problems. A blood pressure reading consists of a higher number over a lower number. Ideally, your blood pressure should be below 120/80. The first ("top")  number is called the systolic pressure. It is a measure of the pressure in your arteries as your heart beats. The second ("bottom") number is called the diastolic pressure. It is a measure of the pressure in your arteries as the heart relaxes. What are the causes? The cause of this condition is not known. What increases the risk? Some risk factors for high blood pressure are under your control. Others are not. Factors you can change   Smoking.  Having type 2 diabetes mellitus, high cholesterol, or both.  Not getting enough exercise or physical activity.  Being overweight.  Having too much fat, sugar, calories, or salt (sodium) in your diet.  Drinking too much alcohol. Factors that are difficult or impossible to change   Having chronic kidney disease.  Having a family history of high blood pressure.  Age. Risk increases with age.  Race. You may be at higher risk if you are African-American.  Gender. Men are at higher risk than women before age 50. After age 43, women  are at higher risk than men.  Having obstructive sleep apnea.  Stress. What are the signs or symptoms? Extremely high blood pressure (hypertensive crisis) may cause:  Headache.  Anxiety.  Shortness of breath.  Nosebleed.  Nausea and vomiting.  Severe chest pain.  Jerky movements you cannot control (seizures). How is this diagnosed? This condition is diagnosed by measuring your blood pressure while you are seated, with your arm resting on a surface. The cuff of the blood pressure monitor will be placed directly against the skin of your upper arm at the level of your heart. It should be measured at least twice using the same arm. Certain conditions can cause a difference in blood pressure between your right and left arms. Certain factors can cause blood pressure readings to be lower or higher than normal (elevated) for a short period of time:  When your blood pressure is higher when you are in a health  care provider's office than when you are at home, this is called white coat hypertension. Most people with this condition do not need medicines.  When your blood pressure is higher at home than when you are in a health care provider's office, this is called masked hypertension. Most people with this condition may need medicines to control blood pressure. If you have a high blood pressure reading during one visit or you have normal blood pressure with other risk factors:  You may be asked to return on a different day to have your blood pressure checked again.  You may be asked to monitor your blood pressure at home for 1 week or longer. If you are diagnosed with hypertension, you may have other blood or imaging tests to help your health care provider understand your overall risk for other conditions. How is this treated? This condition is treated by making healthy lifestyle changes, such as eating healthy foods, exercising more, and reducing your alcohol intake. Your health care provider may prescribe medicine if lifestyle changes are not enough to get your blood pressure under control, and if:  Your systolic blood pressure is above 130.  Your diastolic blood pressure is above 80. Your personal target blood pressure may vary depending on your medical conditions, your age, and other factors. Follow these instructions at home: Eating and drinking   Eat a diet that is high in fiber and potassium, and low in sodium, added sugar, and fat. An example eating plan is called the DASH (Dietary Approaches to Stop Hypertension) diet. To eat this way:  Eat plenty of fresh fruits and vegetables. Try to fill half of your plate at each meal with fruits and vegetables.  Eat whole grains, such as whole wheat pasta, brown rice, or whole grain bread. Fill about one quarter of your plate with whole grains.  Eat or drink low-fat dairy products, such as skim milk or low-fat yogurt.  Avoid fatty cuts of meat,  processed or cured meats, and poultry with skin. Fill about one quarter of your plate with lean proteins, such as fish, chicken without skin, beans, eggs, and tofu.  Avoid premade and processed foods. These tend to be higher in sodium, added sugar, and fat.  Reduce your daily sodium intake. Most people with hypertension should eat less than 1,500 mg of sodium a day.  Limit alcohol intake to no more than 1 drink a day for nonpregnant women and 2 drinks a day for men. One drink equals 12 oz of beer, 5 oz of wine, or 1 oz of  hard liquor. Lifestyle   Work with your health care provider to maintain a healthy body weight or to lose weight. Ask what an ideal weight is for you.  Get at least 30 minutes of exercise that causes your heart to beat faster (aerobic exercise) most days of the week. Activities may include walking, swimming, or biking.  Include exercise to strengthen your muscles (resistance exercise), such as pilates or lifting weights, as part of your weekly exercise routine. Try to do these types of exercises for 30 minutes at least 3 days a week.  Do not use any products that contain nicotine or tobacco, such as cigarettes and e-cigarettes. If you need help quitting, ask your health care provider.  Monitor your blood pressure at home as told by your health care provider.  Keep all follow-up visits as told by your health care provider. This is important. Medicines   Take over-the-counter and prescription medicines only as told by your health care provider. Follow directions carefully. Blood pressure medicines must be taken as prescribed.  Do not skip doses of blood pressure medicine. Doing this puts you at risk for problems and can make the medicine less effective.  Ask your health care provider about side effects or reactions to medicines that you should watch for. Contact a health care provider if:  You think you are having a reaction to a medicine you are taking.  You have  headaches that keep coming back (recurring).  You feel dizzy.  You have swelling in your ankles.  You have trouble with your vision. Get help right away if:  You develop a severe headache or confusion.  You have unusual weakness or numbness.  You feel faint.  You have severe pain in your chest or abdomen.  You vomit repeatedly.  You have trouble breathing. Summary  Hypertension is when the force of blood pumping through your arteries is too strong. If this condition is not controlled, it may put you at risk for serious complications.  Your personal target blood pressure may vary depending on your medical conditions, your age, and other factors. For most people, a normal blood pressure is less than 120/80.  Hypertension is treated with lifestyle changes, medicines, or a combination of both. Lifestyle changes include weight loss, eating a healthy, low-sodium diet, exercising more, and limiting alcohol. This information is not intended to replace advice given to you by your health care provider. Make sure you discuss any questions you have with your health care provider. Document Released: 07/19/2005 Document Revised: 06/16/2016 Document Reviewed: 06/16/2016 Elsevier Interactive Patient Education  2017 [employer].

## 2016-11-26 NOTE — Progress Notes (Signed)
Occupational Therapy Treatment Patient Details Name: Lauren Aguayo MRN: [id number] DOB: May 10, 1956 Today's Date: 11/26/2016    History of present illness Pt admitted 11/21/16 with extreme hypertension, penatrating thoracic aorta ulcer with hematoma. 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   OT comments  Pt continues to be highly motivated. Improved strength of R UE and use a semi-functional assist with dressing. Issued and instructed in self feeding with foam build up for utensil. Pt eager to get to rehab.  Follow Up Recommendations  CIR    Equipment Recommendations       Recommendations for Other Services      Precautions / Restrictions Precautions Precautions: Fall       Mobility Bed Mobility Overal bed mobility: Needs Assistance Bed Mobility: Supine to Sit;Sit to Supine     Supine to sit: Supervision;HOB elevated Sit to supine: Min guard   General bed mobility comments: no physical assist required  Transfers Overall transfer level: Needs assistance Equipment used: Rolling walker (2 wheeled) Transfers: Sit to/from Stand Sit to Stand: Min assist         General transfer comment: cues for safety, assist for balance    Balance Overall balance assessment: Needs assistance   Sitting balance-Leahy Scale: Good Sitting balance - Comments: no LOB with donning socks     Standing balance-Leahy Scale: Poor Standing balance comment: requires B UE in static standing                           ADL either performed or assessed with clinical judgement   ADL Overall ADL's : Needs assistance/impaired Eating/Feeding: Set up;Sitting Eating/Feeding Details (indicate cue type and reason): issued pt foam build up for self feeding with R UE             Upper Body Dressing : Minimal assistance;Sitting Upper Body Dressing Details (indicate cue type and  reason): began instruction in hemi techniques for UE dressing, donned and doffed front opening gown Lower Body Dressing: Minimal assistance;Sitting/lateral leans Lower Body Dressing Details (indicate cue type and reason): donned and doffed socks with min assist, using R UE                      Vision       Perception     Praxis      Cognition Arousal/Alertness: Awake/alert Behavior During Therapy: Impulsive Overall Cognitive Status: Impaired/Different from baseline Area of Impairment: Safety/judgement                         Safety/Judgement: Decreased awareness of deficits              Exercises     Shoulder Instructions       General Comments Performed PNF patterns with moderate resistance with R UE. Place and hold x 5 R shoulder flexion 5 seconds each. AROM x 10 R wrist extension, Wide range reaching of ADL items with R UE seated EOB.    Pertinent Vitals/ Pain       Pain Assessment: No/denies pain  Home Living     Available Help at Discharge: Other (Comment) (wife to assist as needed at d/c)               Bathroom Shower/Tub: Tub/shower unit;Curtain     Bathroom Accessibility: Yes How Accessible: Accessible via walker  Lives With: Alone (seperated for 8 years from his wife; no children)    Prior Functioning/Environment              Frequency  Min 3X/week        Progress Toward Goals  OT Goals(current goals can now be found in the care plan section)  Progress towards OT goals: Progressing toward goals  Acute Rehab OT Goals Patient Stated Goal: return to PLOF Time For Goal Achievement: 12/08/16 Potential to Achieve Goals: Good  Plan Discharge plan remains appropriate    Co-evaluation                 End of Session Equipment Utilized During Treatment: Gait belt;Rolling walker  OT Visit Diagnosis: Unsteadiness on feet (R26.81);Other abnormalities of gait and mobility (R26.89);Hemiplegia and  hemiparesis;Other symptoms and signs involving cognitive function;Muscle weakness (generalized) (M62.81) Hemiplegia - Right/Left: Right Hemiplegia - dominant/non-dominant: Dominant Hemiplegia - caused by: Cerebral infarction   Activity Tolerance Patient tolerated treatment well   Patient Left in bed;with call bell/phone within reach;with family/visitor present   Nurse Communication  (pt with CT soon)        Time: 1121-1150 OT Time Calculation (min): 29 min  Charges: OT General Charges $OT Visit: 1 Procedure OT Treatments $Self Care/Home Management : 8-22 mins $Neuromuscular Re-education: 8-22 mins     Evern Bio 11/26/2016, 1:00 PM  [phone]

## 2016-11-26 NOTE — PMR Pre-admission (Signed)
PMR Admission Coordinator Pre-Admission Assessment  Patient: Jordan Mcintosh is an 61 y.o., male MRN: [id number] DOB: 03/13/1956 Height:  (180.3 cm) Weight: 96.6 kg (213 lb)              Insurance Information HMO:     PPO: yes     PCP:      IPA:      80/20:      OTHER:  PRIMARY: [employer] [place]      Policy#: [id number]      Subscriber: pt CM Name: Jordan Mcintosh      Phone#: [phone]     Fax#: [phone] Pre-Cert#: [id number] approved until 12/10/16 when updates are due      Employer: [employer] Benefits:  Phone #: [phone]     Name: 11/25/16 Eff. Date: 05/02/2016     Deduct: $1500      Out of Pocket Max: $4000      Life Max: none CIR: $250 co pay per admit then insurance covers 90%      SNF: 90% 60 days Outpatient: $25 co pay per visit     Co-Pay: 30 visits each Home Health: 90%      Co-Pay: 60 visits DME: 70%     Co-Pay: 30% Providers: in network  SECONDARY: none       Medicaid Application Date:       Case Manager:  Disability Application Date:       Case Worker:   Emergency Contact Information Contact Information    Name Relation Home Work Mobile   [place] Spouse [phone]  [phone]     Current Medical History  Patient Admitting Diagnosis: left lacunar infarct   History of Present Illness: HPI: Jordan Mcintosh an 61 y.o.malewith h/o of HTN, polysubstance abuse, medical noncompliance (no meds X 6 months) who was admitted on 11/21/16 with complaints of abdominal pain and back pain. 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 He was started on cardene drip and morphine with resolution of symptoms. On 4/23 am, he developed acute onset of right facial weakness, slurred speech and RUE weakness. CT head negative . 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  2 D echo with EF 55-60% and no wall abnormality.  Dr. Roda Shutters felt that stroke embolic due to aortic origin or thrombotic due to SVD and recommended ASA for secondary stroke prevention. Blood pressures better controlled and he has been slowly weaned off  IV labetalol with titration of BP medications.  Follow up CT abdomen pending on 11/26/2016.  He reported difficulty swallowing and esophagram done showing mild esophageal dysmotility with delayed passage of barium tablet. Patient with resultant RUE> RLE weakness with unsteady gait, tendency to lean to the right, impulsivity and lacks awareness of deficits.    Past Medical History  Past Medical History:  Diagnosis Date  . Hypertension     Family History  family history includes Stroke in his daughter, mother, and sister.  Prior Rehab/Hospitalizations:  Has the patient had major surgery during 100 days prior to admission? No  Current Medications   Current Facility-Administered Medications:  .  0.9 %  sodium chloride infusion, , Intravenous, Continuous, Kerin Perna, MD, Stopped at 11/25/16 1900 .  acetaminophen (TYLENOL) tablet 650 mg, 650 mg, Oral, Q6H PRN, 650 mg at 11/23/16 1206 **OR** acetaminophen (TYLENOL) suppository 650 mg, 650 mg, Rectal, Q6H PRN, Kerin Perna, MD .  amLODipine (NORVASC) tablet 5 mg, 5 mg, Oral, Daily, Kerin Perna, MD, 5 mg at 11/26/16 [phone] .  aspirin chewable tablet 81 mg, 81 mg, Oral, Daily, Kerin Perna, MD, 81 mg at 11/26/16 9604 .  atorvastatin (LIPITOR) tablet 40 mg, 40 mg, Oral, q1800, Marvel Plan, MD, 40 mg at 11/26/16 1757 .  cloNIDine (CATAPRES - Dosed in mg/24 hr) patch 0.2 mg, 0.2 mg, Transdermal, Weekly, Kerin Perna, MD, 0.2 mg at  11/26/16 0942 .  fentaNYL (SUBLIMAZE) injection 50 mcg, 50 mcg, Intravenous, Q8H PRN, Kerin Perna, MD .  furosemide (LASIX) tablet 40 mg, 40 mg, Oral, Daily, Kerin Perna, MD, 40 mg at 11/26/16 0852 .  gi cocktail (Maalox,Lidocaine,Donnatal), 30 mL, Oral, Once, Gerhard Munch, MD, Stopped at 11/21/16 1200 .  labetalol (NORMODYNE,TRANDATE) injection 10 mg, 10 mg, Intravenous, Q2H PRN, Kerin Perna, MD, 10 mg at 11/26/16 0853 .  losartan (COZAAR) tablet 150 mg, 150 mg, Oral, Daily, Kerin Perna, MD, 150 mg at 11/26/16 5409 .  magnesium hydroxide (MILK OF MAGNESIA) suspension 30 mL, 30 mL, Oral, Daily PRN, Kerin Perna, MD .  metoprolol (LOPRESSOR) tablet 100 mg, 100 mg, Oral, BID, Kerin Perna, MD, 100 mg at 11/26/16 2153 .  multivitamin with minerals tablet 1 tablet, 1 tablet, Oral, Daily, Kerin Perna, MD, 1 tablet at 11/26/16 [id number] .  ondansetron (ZOFRAN) tablet 4 mg, 4 mg, Oral, Q6H PRN **OR** ondansetron (ZOFRAN) injection 4 mg, 4 mg, Intravenous, Q6H PRN, Kerin Perna, MD .  pantoprazole (PROTONIX) EC tablet 40 mg, 40 mg, Oral, BID, Kerin Perna, MD, 40 mg at 11/26/16 2153 .  simethicone (MYLICON) chewable tablet 80 mg, 80 mg, Oral, QID, Kerin Perna, MD, 80 mg at 11/26/16 2153 .  sorbitol 70 % solution 30 mL, 30 mL, Oral, Daily PRN, Kerin Perna, MD .  traMADol Janean Sark) tablet 50 mg, 50 mg, Oral, Q6H PRN, Kerin Perna, MD  Patients Current Diet: Diet Heart Room service appropriate? Yes; Fluid consistency: Thin  Precautions / Restrictions Precautions Precautions: Fall Restrictions Weight Bearing Restrictions: No   Has the patient had 2 or more falls or a fall with injury in the past year?No  Prior Activity Level Community (5-7x/wk): Independent and driving; worked night shift at [employer].   Home Assistive Devices / Equipment Home Assistive Devices/Equipment: Dentures (specify type), Eyeglasses Home Equipment: None  Prior Device Use:  Indicate devices/aids used by the patient prior to current illness, exacerbation or injury? None of the above  Prior Functional Level Prior Function Level of Independence: Independent 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, product/process development, drives  Self Care: Did the patient need help bathing, dressing, using the toilet or eating?  Independent  Indoor Mobility: Did the patient need assistance with walking from room to room (with or without device)? Independent  Stairs: Did the patient need assistance with internal or external stairs (with or without device)? Independent  Functional Cognition: Did the patient need help planning regular tasks such as shopping or remembering to take medications? Independent  Current Functional Level Cognition  Overall Cognitive Status: Impaired/Different from baseline Orientation Level: Oriented X4 Safety/Judgement: Decreased awareness of deficits General Comments: pt very  particular    Extremity Assessment (includes Sensation/Coordination)  Upper Extremity Assessment: RUE deficits/detail RUE Deficits / Details: 3/5 throughout. except wrist and hand at 2+/5 RUE Coordination: decreased fine motor, decreased gross motor  Lower Extremity Assessment: RLE deficits/detail RLE Deficits / Details: grossly 4/5    ADLs  Overall ADL's : Needs assistance/impaired Eating/Feeding: Set up, Sitting Eating/Feeding Details (indicate cue type and reason): issued pt foam build up for self feeding with R UE Grooming: Wash/dry face, Sitting, Minimal assistance, Wash/dry hands Upper Body Bathing: Moderate assistance, Sitting Lower Body Bathing: Total assistance, Sit to/from stand Upper Body Dressing : Minimal assistance, Sitting Upper Body Dressing Details (indicate cue type and reason): began instruction in hemi techniques for UE dressing, donned and doffed front opening gown Lower Body Dressing: Minimal assistance, Sitting/lateral leans Lower Body Dressing Details (indicate cue type and  reason): donned and doffed socks with min assist, using R UE  Functional mobility during ADLs: +2 for physical assistance, Moderate assistance, Rolling walker, Cueing for safety (able to maintain R hand on walker with intermittent assist)    Mobility  Overal bed mobility: Needs Assistance Bed Mobility: Supine to Sit, Sit to Supine Supine to sit: Supervision, HOB elevated Sit to supine: Min guard General bed mobility comments: no physical assist required    Transfers  Overall transfer level: Needs assistance Equipment used: Rolling walker (2 wheeled) Transfers: Sit to/from Stand Sit to Stand: Min assist General transfer comment: cues for safety, assist for balance    Ambulation / Gait / Stairs / Wheelchair Mobility  Ambulation/Gait Ambulation/Gait assistance: Min assist, Mod assist Ambulation Distance (Feet): 80 Feet (x 2) Assistive device: Rolling walker (2 wheeled) Gait Pattern/deviations: Step-through pattern, Decreased stride length, Wide base of support, Drifts right/left, Decreased weight shift to left General Gait Details: assist for balance, leans to R and esp with turns needing increased assist for safety, assist for R hand on walker Gait velocity: slow Gait velocity interpretation: <1.8 ft/sec, indicative of risk for recurrent falls    Posture / Balance Dynamic Sitting Balance Sitting balance - Comments: no LOB with donning socks Balance Overall balance assessment: Needs assistance Sitting balance-Leahy Scale: Good Sitting balance - Comments: no LOB with donning socks Standing balance support: Single extremity supported, During functional activity Standing balance-Leahy Scale: Poor Standing balance comment: requires B UE in static standing    Special needs/care consideration BiPAP/CPAP  N/a CPM  N/a Continuous Drip IV  N/a Dialysis  N/a Life Vest  N/a Oxygen  N/a Special Bed  N/a Trach Size   N/a Wound Vac n/a Skin  Moisture associated area to groin Bowel mgmt:  continent LBM 11/24/16 Bladder mgmt: continent Diabetic mgmt  N/a   Previous Home Environment Living Arrangements: Alone  Lives With: Alone (seperated for 8 years from his wife; no children) Available Help at Discharge: Other (Comment) (wife to assist as needed at d/c) Type of Home: Apartment Home Layout: One level Home Access: Stairs to enter Entrance Stairs-Rails: None Entrance Stairs-Number of Steps: 2 Bathroom Shower/Tub: Tub/shower unit, Buyer, retail: Yes How Accessible: Accessible via walker Home Care Services: No  Discharge Living Setting Plans for Discharge Living Setting: Lives with (comment) (will either go to his home or go to his wife's home at d/c d) Type of Home at Discharge: Apartment Discharge Home Layout: One level Discharge Home Access: Stairs to enter Entrance Stairs-Rails: None Entrance Stairs-Number of Steps: 2 Discharge Bathroom Shower/Tub: Tub/shower unit, Curtain Discharge Bathroom Toilet: Standard Discharge Bathroom Accessibility:  Yes How Accessible: Accessible via walker Does the patient have any problems obtaining your medications?:  (pt admits not taking his BP meds for 6 months)  Social/Family/Support Systems Patient Roles: Spouse, Other (Comment) (fulltime emplyee; 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 He plans to return to his home is functionally ab;e, if not he will d/c home  with his wife. She works second shift.  Goals/Additional Needs Patient/Family Goal for Rehab: Mod I with PT, OT, and SLP Expected length of stay: [facility]OS 7-12 days Pt/Family Agrees to Admission and willing to participate: Yes Program Orientation Provided & Reviewed with Pt/Caregiver Including Roles  & Responsibilities: Yes  Decrease burden of Care through IP rehab admission: n/a  Possible need for SNF placement upon discharge: not anticipated  Patient Condition: This patient's medical and functional status has changed since the consult dated: 11/24/2016 in which the Rehabilitation Physician determined and documented that the patient's condition is appropriate for intensive rehabilitative care in an inpatient rehabilitation facility. See "History of Present Illness" (above) for medical update. Functional changes are: overall min to mod assist Patient's medical and functional status update has been discussed with the Rehabilitation physician and patient remains appropriate for inpatient rehabilitation. Will admit to inpatient rehab today.  Preadmission Screen Completed By:  Clois Dupes, 11/27/2016 7:30 AM ______________________________________________________________________   Discussed status with Dr. Wynn Banker on 11/27/2016 at 0730 and received telephone approval for admission today.  Admission Coordinator:  Clois Dupes, time 4098 Date 11/27/2016

## 2016-11-27 ENCOUNTER — Encounter (HOSPITAL_COMMUNITY): Payer: Self-pay | Admitting: *Deleted

## 2016-11-27 ENCOUNTER — Inpatient Hospital Stay (HOSPITAL_COMMUNITY)
Admission: RE | Admit: 2016-11-27 | Discharge: 2016-12-03 | DRG: 056 | Disposition: A | Discharge status: Home / Self Care | Payer: BLUE CROSS/BLUE SHIELD | Attending: Physical Medicine & Rehabilitation | Admitting: Physical Medicine & Rehabilitation

## 2016-11-27 DIAGNOSIS — Z823 Family history of stroke: Secondary | ICD-10-CM

## 2016-11-27 DIAGNOSIS — Z9119 Patient's noncompliance with other medical treatment and regimen: Secondary | ICD-10-CM | POA: Diagnosis not present

## 2016-11-27 DIAGNOSIS — Z79899 Other long term (current) drug therapy: Secondary | ICD-10-CM

## 2016-11-27 DIAGNOSIS — I69351 Hemiplegia and hemiparesis following cerebral infarction affecting right dominant side: Secondary | ICD-10-CM | POA: Diagnosis present

## 2016-11-27 DIAGNOSIS — T148XXA Other injury of unspecified body region, initial encounter: Secondary | ICD-10-CM

## 2016-11-27 DIAGNOSIS — R269 Unspecified abnormalities of gait and mobility: Secondary | ICD-10-CM

## 2016-11-27 DIAGNOSIS — I69398 Other sequelae of cerebral infarction: Secondary | ICD-10-CM | POA: Diagnosis not present

## 2016-11-27 DIAGNOSIS — Z6830 Body mass index (BMI) 30.0-30.9, adult: Secondary | ICD-10-CM | POA: Diagnosis not present

## 2016-11-27 DIAGNOSIS — Z7982 Long term (current) use of aspirin: Secondary | ICD-10-CM

## 2016-11-27 DIAGNOSIS — I7100 Dissection of unspecified site of aorta: Secondary | ICD-10-CM

## 2016-11-27 DIAGNOSIS — I7101 Dissection of thoracic aorta: Secondary | ICD-10-CM

## 2016-11-27 DIAGNOSIS — R7303 Prediabetes: Secondary | ICD-10-CM

## 2016-11-27 DIAGNOSIS — Z87891 Personal history of nicotine dependence: Secondary | ICD-10-CM

## 2016-11-27 DIAGNOSIS — I69322 Dysarthria following cerebral infarction: Secondary | ICD-10-CM

## 2016-11-27 DIAGNOSIS — E8809 Other disorders of plasma-protein metabolism, not elsewhere classified: Secondary | ICD-10-CM

## 2016-11-27 DIAGNOSIS — I639 Cerebral infarction, unspecified: Secondary | ICD-10-CM | POA: Diagnosis present

## 2016-11-27 DIAGNOSIS — I10 Essential (primary) hypertension: Secondary | ICD-10-CM

## 2016-11-27 DIAGNOSIS — N179 Acute kidney failure, unspecified: Secondary | ICD-10-CM | POA: Diagnosis not present

## 2016-11-27 DIAGNOSIS — E785 Hyperlipidemia, unspecified: Secondary | ICD-10-CM | POA: Diagnosis not present

## 2016-11-27 DIAGNOSIS — I6350 Cerebral infarction due to unspecified occlusion or stenosis of unspecified cerebral artery: Secondary | ICD-10-CM | POA: Diagnosis not present

## 2016-11-27 DIAGNOSIS — I6340 Cerebral infarction due to embolism of unspecified cerebral artery: Secondary | ICD-10-CM | POA: Diagnosis not present

## 2016-11-27 DIAGNOSIS — K224 Dyskinesia of esophagus: Secondary | ICD-10-CM

## 2016-11-27 DIAGNOSIS — Z91199 Patient's noncompliance with other medical treatment and regimen due to unspecified reason: Secondary | ICD-10-CM

## 2016-11-27 DIAGNOSIS — E46 Unspecified protein-calorie malnutrition: Secondary | ICD-10-CM | POA: Diagnosis not present

## 2016-11-27 DIAGNOSIS — I6381 Other cerebral infarction due to occlusion or stenosis of small artery: Secondary | ICD-10-CM

## 2016-11-27 DIAGNOSIS — G8191 Hemiplegia, unspecified affecting right dominant side: Secondary | ICD-10-CM | POA: Diagnosis not present

## 2016-11-27 DIAGNOSIS — I69392 Facial weakness following cerebral infarction: Secondary | ICD-10-CM | POA: Diagnosis not present

## 2016-11-27 MED ORDER — TRAMADOL HCL 50 MG PO TABS
50.0000 mg | ORAL_TABLET | Freq: Four times a day (QID) | ORAL | Status: DC | PRN
  Administered 2016-11-28 – 2016-12-03 (×11): 50 mg via ORAL
  Filled 2016-11-27 (×13): qty 1

## 2016-11-27 MED ORDER — ASPIRIN 81 MG PO CHEW: 81.0000 mg | CHEWABLE_TABLET | Freq: Every day | ORAL | Status: DC

## 2016-11-27 MED ORDER — LOSARTAN POTASSIUM 50 MG PO TABS
150.0000 mg | ORAL_TABLET | Freq: Every day | ORAL | Status: DC
  Administered 2016-11-28 – 2016-12-03 (×6): 150 mg via ORAL
  Filled 2016-11-27 (×6): qty 3

## 2016-11-27 MED ORDER — POLYETHYLENE GLYCOL 3350 17 G PO PACK: 17.0000 g | PACK | Freq: Every day | ORAL | Status: DC | PRN

## 2016-11-27 MED ORDER — PROCHLORPERAZINE MALEATE 5 MG PO TABS: 5.0000 mg | ORAL_TABLET | Freq: Four times a day (QID) | ORAL | Status: DC | PRN

## 2016-11-27 MED ORDER — FUROSEMIDE 40 MG PO TABS
40.0000 mg | ORAL_TABLET | Freq: Every day | ORAL | Status: DC
  Administered 2016-11-28 – 2016-12-03 (×6): 40 mg via ORAL
  Filled 2016-11-27 (×6): qty 1

## 2016-11-27 MED ORDER — AMLODIPINE BESYLATE 5 MG PO TABS
5.0000 mg | ORAL_TABLET | Freq: Every day | ORAL | Status: DC
  Administered 2016-11-28 – 2016-12-03 (×6): 5 mg via ORAL
  Filled 2016-11-27 (×6): qty 1

## 2016-11-27 MED ORDER — SIMETHICONE 80 MG PO CHEW
80.0000 mg | CHEWABLE_TABLET | Freq: Four times a day (QID) | ORAL | Status: DC
  Administered 2016-11-27 – 2016-12-03 (×22): 80 mg via ORAL
  Filled 2016-11-27 (×22): qty 1

## 2016-11-27 MED ORDER — ONDANSETRON HCL 4 MG PO TABS: 4.0000 mg | ORAL_TABLET | Freq: Four times a day (QID) | ORAL | Status: DC | PRN

## 2016-11-27 MED ORDER — TRAZODONE HCL 50 MG PO TABS
25.0000 mg | ORAL_TABLET | Freq: Every evening | ORAL | Status: DC | PRN
Start: 2016-11-27 — End: 2016-12-03
  Administered 2016-11-27 – 2016-11-30 (×3): 50 mg via ORAL
  Filled 2016-11-27 (×4): qty 1

## 2016-11-27 MED ORDER — SIMETHICONE 80 MG PO CHEW: 80.0000 mg | CHEWABLE_TABLET | Freq: Four times a day (QID) | ORAL | 0 refills | Status: AC

## 2016-11-27 MED ORDER — TRAMADOL HCL 50 MG PO TABS: 50.0000 mg | ORAL_TABLET | Freq: Four times a day (QID) | ORAL | Status: DC | PRN

## 2016-11-27 MED ORDER — PANTOPRAZOLE SODIUM 40 MG PO TBEC: 40.0000 mg | DELAYED_RELEASE_TABLET | Freq: Two times a day (BID) | ORAL | Status: DC

## 2016-11-27 MED ORDER — GUAIFENESIN-DM 100-10 MG/5ML PO SYRP: 5.0000 mL | ORAL_SOLUTION | Freq: Four times a day (QID) | ORAL | Status: DC | PRN

## 2016-11-27 MED ORDER — FLEET ENEMA 7-19 GM/118ML RE ENEM: 1.0000 | ENEMA | Freq: Once | RECTAL | Status: DC | PRN

## 2016-11-27 MED ORDER — ATORVASTATIN CALCIUM 40 MG PO TABS
40.0000 mg | ORAL_TABLET | Freq: Every day | ORAL | Status: DC
  Administered 2016-11-28 – 2016-12-02 (×5): 40 mg via ORAL
  Filled 2016-11-27 (×5): qty 1

## 2016-11-27 MED ORDER — ACETAMINOPHEN 325 MG PO TABS
325.0000 mg | ORAL_TABLET | ORAL | Status: DC | PRN
  Administered 2016-11-28 – 2016-12-03 (×11): 650 mg via ORAL
  Filled 2016-11-27 (×12): qty 2

## 2016-11-27 MED ORDER — ATORVASTATIN CALCIUM 40 MG PO TABS: 40.0000 mg | ORAL_TABLET | Freq: Every day | ORAL | Status: DC

## 2016-11-27 MED ORDER — PROCHLORPERAZINE 25 MG RE SUPP: 12.5000 mg | Freq: Four times a day (QID) | RECTAL | Status: DC | PRN

## 2016-11-27 MED ORDER — CLONIDINE HCL 0.2 MG/24HR TD PTWK: 0.2000 mg | MEDICATED_PATCH | TRANSDERMAL | 12 refills | Status: DC

## 2016-11-27 MED ORDER — AMLODIPINE BESYLATE 5 MG PO TABS: 5.0000 mg | ORAL_TABLET | Freq: Every day | ORAL | Status: DC

## 2016-11-27 MED ORDER — METOPROLOL TARTRATE 100 MG PO TABS: 100.0000 mg | ORAL_TABLET | Freq: Two times a day (BID) | ORAL | Status: DC

## 2016-11-27 MED ORDER — ACETAMINOPHEN 325 MG PO TABS: 650.0000 mg | ORAL_TABLET | Freq: Four times a day (QID) | ORAL | Status: DC | PRN

## 2016-11-27 MED ORDER — METOPROLOL TARTRATE 50 MG PO TABS
100.0000 mg | ORAL_TABLET | Freq: Two times a day (BID) | ORAL | Status: DC
  Administered 2016-11-27 – 2016-12-03 (×12): 100 mg via ORAL
  Filled 2016-11-27 (×14): qty 2

## 2016-11-27 MED ORDER — DIPHENHYDRAMINE HCL 12.5 MG/5ML PO ELIX
12.5000 mg | ORAL_SOLUTION | Freq: Four times a day (QID) | ORAL | Status: DC | PRN
Start: 2016-11-27 — End: 2016-12-03

## 2016-11-27 MED ORDER — ONDANSETRON HCL 4 MG/2ML IJ SOLN: 4.0000 mg | Freq: Four times a day (QID) | INTRAMUSCULAR | Status: DC | PRN

## 2016-11-27 MED ORDER — FUROSEMIDE 40 MG PO TABS: 40.0000 mg | ORAL_TABLET | Freq: Every day | ORAL | Status: DC

## 2016-11-27 MED ORDER — PROCHLORPERAZINE EDISYLATE 5 MG/ML IJ SOLN: 5.0000 mg | Freq: Four times a day (QID) | INTRAMUSCULAR | Status: DC | PRN

## 2016-11-27 MED ORDER — BISACODYL 10 MG RE SUPP: 10.0000 mg | Freq: Every day | RECTAL | Status: DC | PRN

## 2016-11-27 MED ORDER — CLONIDINE HCL 0.2 MG/24HR TD PTWK
0.2000 mg | MEDICATED_PATCH | TRANSDERMAL | Status: DC
  Administered 2016-12-03: 0.2 mg via TRANSDERMAL
  Filled 2016-11-27: qty 1

## 2016-11-27 MED ORDER — LOSARTAN POTASSIUM 50 MG PO TABS: 150.0000 mg | ORAL_TABLET | Freq: Every day | ORAL | Status: DC

## 2016-11-27 MED ORDER — CLONIDINE HCL 0.1 MG PO TABS: 0.1000 mg | ORAL_TABLET | ORAL | Status: DC | PRN

## 2016-11-27 MED ORDER — ADULT MULTIVITAMIN W/MINERALS CH
1.0000 | ORAL_TABLET | Freq: Every day | ORAL | Status: DC
  Administered 2016-11-28 – 2016-12-03 (×6): 1 via ORAL
  Filled 2016-11-27 (×6): qty 1

## 2016-11-27 MED ORDER — PANTOPRAZOLE SODIUM 40 MG PO TBEC
40.0000 mg | DELAYED_RELEASE_TABLET | Freq: Two times a day (BID) | ORAL | Status: DC
  Administered 2016-11-27 – 2016-12-03 (×12): 40 mg via ORAL
  Filled 2016-11-27 (×13): qty 1

## 2016-11-27 MED ORDER — ASPIRIN 81 MG PO CHEW
81.0000 mg | CHEWABLE_TABLET | Freq: Every day | ORAL | Status: DC
  Administered 2016-11-28 – 2016-12-03 (×6): 81 mg via ORAL
  Filled 2016-11-27 (×6): qty 1

## 2016-11-27 MED ORDER — ALUM & MAG HYDROXIDE-SIMETH 200-200-20 MG/5ML PO SUSP: 30.0000 mL | ORAL | Status: DC | PRN

## 2016-11-27 MED ORDER — ADULT MULTIVITAMIN W/MINERALS CH: 1.0000 | ORAL_TABLET | Freq: Every day | ORAL | Status: AC

## 2016-11-27 NOTE — Progress Notes (Signed)
Patient arrived on unit approximately 1815 via wheelchair by two nurses from previous unit. He is alert and oriented x 4. He denies pain at this time. He is in no acute distress at present.

## 2016-11-27 NOTE — Progress Notes (Signed)
Ranelle Oyster, MD Physician Signed Physical Medicine and Rehabilitation  Consult Note Date of Service: 11/24/2016 5:57 AM  Related encounter: ED to Hosp-Admission (Discharged) from 11/21/2016 in [place] [facility]     Expand All Collapse All   Hide copied text Hover for attribution information      Physical Medicine and Rehabilitation Consult Reason for Consult: Right sided weakness and slurred speech Referring Physician: Dr. Morton Peters   HPI: Jordan Mcintosh is a 61 y.o. right handed male with history of hypertension, tobacco and polysubstance abuse as well as poor medical compliance. Per chart review patient lives alone in [place]. Independent and still working. Elderly parents in the area of limited assistance. Presented 11/21/2016 with abdominal pain and back pain as well as right facial droop and right-sided weakness with slurred speech. UDS positive opiates. 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 He also had marked hypertension which required emergency intervention with Cardene drip. Cranial CT scan showed age indeterminate hypodensity involving the left lentiform nucleus. MRI of the brain showed acute lacunar infarct extending from the left posterior left corona radiata through to the left external capsule and posterior left lentiform. CT Angio head and neck showed no emergent large vessel occlusion. Echocardiogram with ejection fraction of 60% no wall motion abnormalities. Initial blood pressure control with labetalol drip. Cardiothoracic surgery follow-up for penetrating ulcer hematoma of descending thoracic aorta and no current plan for surgical intervention. Neurology has been consulted for workup of CVA. Physical and occupational therapy evaluations completed with recommendations of physical medicine rehabilitation consult   Review of Systems  Constitutional: Negative  for chills and fever.  HENT: Negative for hearing loss.   Eyes: Negative for blurred vision and double vision.  Respiratory: Positive for shortness of breath. Negative for cough.   Cardiovascular: Positive for palpitations and leg swelling. Negative for chest pain.  Gastrointestinal: Positive for constipation. Negative for nausea.  Genitourinary: Negative for dysuria, flank pain and hematuria.  Musculoskeletal: Positive for joint pain and myalgias.  Skin: Negative for rash.  Neurological: Positive for speech change, focal weakness and weakness. Negative for seizures.  All other systems reviewed and are negative.      Past Medical History:  Diagnosis Date  . Hypertension    History reviewed. No pertinent surgical history. History reviewed. No pertinent family history. Social History:  reports that he has quit smoking. He has never used smokeless tobacco. He reports that he uses drugs, including Marijuana. He reports that he does not drink alcohol. Allergies:      Allergies  Allergen Reactions  . Codeine Nausea Only   No prescriptions prior to admission.    Home: Home Living Family/patient expects to be discharged to:: Private residence Living Arrangements: Alone  Functional History: Functional Status:  Mobility:  ADL:  Cognition: Cognition Orientation Level: Oriented X4  Blood pressure (!) 146/82, pulse 77, temperature 98 F (36.7 C), temperature source Oral, resp. rate (!) 23, height  (1.803 m), weight 90.7 kg (200 lb), SpO2 95 %. Physical Exam  HENT:  Facial droop  Eyes: EOM are normal. Left eye exhibits no discharge.  Neck: Normal range of motion. Neck supple. No thyromegaly present.  Cardiovascular: Normal rate and regular rhythm.   Respiratory: Effort normal and breath sounds normal. No respiratory distress.  GI: Soft. Bowel sounds are normal. He exhibits no distension.  Neurological: He is alert.  Speech is a bit dysarthric but intelligible.  Follows full commands. Fair awareness of deficits. Right central 7. RUE 3-/5 shoulder,biceps, triceps, 2+ HI. RLE: HF 4-/5 4/5 KE, 4-ADF/PF. LUE and LLE 5/5. Cognitively intact.   Skin: Skin is warm and dry.  Psychiatric: He has a normal mood and affect. His behavior is normal.    Lab Results Last 24 Hours  No results found for this or any previous visit (from the past 24 hour(s)).    Imaging Results (Last 48 hours)  Ct Angio Head W Or Wo Contrast  Result Date: 11/22/2016 CLINICAL DATA:  Stroke with right arm weakness and diminished right hand grip. EXAM: CT ANGIOGRAPHY HEAD AND NECK TECHNIQUE: Multidetector CT imaging of the head and neck was performed using the standard protocol during bolus administration of intravenous contrast. Multiplanar CT image reconstructions and MIPs were obtained to evaluate the vascular anatomy. Carotid stenosis measurements (when applicable) are obtained utilizing NASCET criteria, using the distal internal carotid diameter as the denominator. CONTRAST:  50 cc Isovue 370 intravenous COMPARISON:  Chest CTA.  Brain MRI from earlier today. FINDINGS: CTA NECK FINDINGS Aortic arch: 3 vessel branching pattern. There is a known penetrating ulcer near the isthmus with intramural hematoma. No evidence of progression since previous CTA. Thickening of the proximal left subclavian artery appears similar to the brachiocephalic and is likely atheromatous rather than hematoma. No involvement of the carotid origins is noted. Right carotid system: Atheromatous type wall thickening of the brachiocephalic artery. Moderate mixed density plaque at the common carotid bifurcation. ICA bulb stenosis measures up to 30%. No ulceration or beading. Left carotid system: Calcified plaque at the common carotid origin. No evidence of intramural hematoma at the origin. Mild to moderate predominately calcified plaque at the common carotid bifurcation. No stenosis, ulceration, or beading. Vertebral  arteries: Atheromatous type wall thickening of the proximal subclavian artery. There is a kink in the left subclavian artery just before the vertebral origin approaching 50% stenosis on coronal reformats. Severe atheromatous type narrowing of the left vertebral origin. The codominant right vertebral artery is diffusely patent. No evidence of dissection. Skeleton: No acute or aggressive finding Other neck: No significant incidental finding. Upper chest: Arterial findings recorded above. No acute pulmonary finding. Review of the MIP images confirms the above findings CTA HEAD FINDINGS Anterior circulation: Symmetric carotid arteries. Diffuse calcification the carotid siphons without flow limiting stenosis. Hypoplastic left A1 segment. Fetal type PCA on the left. No large vessel occlusion. Diffuse atheromatous irregularity, most notably long segment of moderate narrowing in the distal right M1 segment, with advanced stenosis of the right MCA bifurcation, primarily affecting the lower branch. No irregularity or notable narrowing of the left ICA or M1 segment. The infarct pattern on previous brain MRI could be anterior choroidal or left lateral lenticulostriate. Negative for aneurysm. Posterior circulation: Symmetric vertebral arteries. Fetal type PCA on the left. Atheromatous irregularity and mild narrowing of the basilar artery. Mild to moderate atheromatous irregularity of the right more than left PCA. No reversible stenosis. Venous sinuses: Patent Anatomic variants: None other than described above Delayed phase: No abnormal intracranial enhancement Review of the MIP images confirms the above findings IMPRESSION: 1. No emergent large vessel occlusion. 2. 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 No evidence of left common carotid involvement to explain the infarct. 3. Moderate cervical carotid atherosclerosis without flow limiting stenosis. 4. Advanced atheromatous narrowing of  the left vertebral artery origin. 5. Intracranial atherosclerosis with moderate distal right M1 segment and advanced right MCA bifurcations  stenosis. Electronically Signed   By: Marnee Spring M.D.   On: 11/22/2016 10:43   Ct Angio Neck W Or Wo Contrast  Result Date: 11/22/2016 CLINICAL DATA:  Stroke with right arm weakness and diminished right hand grip. EXAM: CT ANGIOGRAPHY HEAD AND NECK TECHNIQUE: Multidetector CT imaging of the head and neck was performed using the standard protocol during bolus administration of intravenous contrast. Multiplanar CT image reconstructions and MIPs were obtained to evaluate the vascular anatomy. Carotid stenosis measurements (when applicable) are obtained utilizing NASCET criteria, using the distal internal carotid diameter as the denominator. CONTRAST:  50 cc Isovue 370 intravenous COMPARISON:  Chest CTA.  Brain MRI from earlier today. FINDINGS: CTA NECK FINDINGS Aortic arch: 3 vessel branching pattern. There is a known penetrating ulcer near the isthmus with intramural hematoma. No evidence of progression since previous CTA. Thickening of the proximal left subclavian artery appears similar to the brachiocephalic and is likely atheromatous rather than hematoma. No involvement of the carotid origins is noted. Right carotid system: Atheromatous type wall thickening of the brachiocephalic artery. Moderate mixed density plaque at the common carotid bifurcation. ICA bulb stenosis measures up to 30%. No ulceration or beading. Left carotid system: Calcified plaque at the common carotid origin. No evidence of intramural hematoma at the origin. Mild to moderate predominately calcified plaque at the common carotid bifurcation. No stenosis, ulceration, or beading. Vertebral arteries: Atheromatous type wall thickening of the proximal subclavian artery. There is a kink in the left subclavian artery just before the vertebral origin approaching 50% stenosis on coronal reformats. Severe  atheromatous type narrowing of the left vertebral origin. The codominant right vertebral artery is diffusely patent. No evidence of dissection. Skeleton: No acute or aggressive finding Other neck: No significant incidental finding. Upper chest: Arterial findings recorded above. No acute pulmonary finding. Review of the MIP images confirms the above findings CTA HEAD FINDINGS Anterior circulation: Symmetric carotid arteries. Diffuse calcification the carotid siphons without flow limiting stenosis. Hypoplastic left A1 segment. Fetal type PCA on the left. No large vessel occlusion. Diffuse atheromatous irregularity, most notably long segment of moderate narrowing in the distal right M1 segment, with advanced stenosis of the right MCA bifurcation, primarily affecting the lower branch. No irregularity or notable narrowing of the left ICA or M1 segment. The infarct pattern on previous brain MRI could be anterior choroidal or left lateral lenticulostriate. Negative for aneurysm. Posterior circulation: Symmetric vertebral arteries. Fetal type PCA on the left. Atheromatous irregularity and mild narrowing of the basilar artery. Mild to moderate atheromatous irregularity of the right more than left PCA. No reversible stenosis. Venous sinuses: Patent Anatomic variants: None other than described above Delayed phase: No abnormal intracranial enhancement Review of the MIP images confirms the above findings IMPRESSION: 1. No emergent large vessel occlusion. 2. 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 No evidence of left common carotid involvement to explain the infarct. 3. Moderate cervical carotid atherosclerosis without flow limiting stenosis. 4. Advanced atheromatous narrowing of the left vertebral artery origin. 5. Intracranial atherosclerosis with moderate distal right M1 segment and advanced right MCA bifurcations stenosis. Electronically Signed   By: Marnee Spring M.D.   On:  11/22/2016 10:43   Mr Laqueta Jean ZH Contrast  Result Date: 11/22/2016 CLINICAL DATA:  61 year old male who presented with abdomen and back pain, abnormal descending thoracic aorta, hypertension on labetalol IV. New onset right facial droop right extremity weakness and slurred speech. EXAM: MRI HEAD WITHOUT AND WITH CONTRAST  TECHNIQUE: Multiplanar, multiecho pulse sequences of the brain and surrounding structures were obtained without and with intravenous contrast. CONTRAST:  20mL MULTIHANCE GADOBENATE DIMEGLUMINE 529 MG/ML IV SOLN COMPARISON:  Head CT without contrast 11/21/2016 FINDINGS: Brain: curvilinear restricted diffusion tracking from the posterior left corona radiata into the posterior limb of the left external capsule and posterior left lentiform nuclei corresponding to the indeterminate hypodensity on head CT yesterday. Associated T2 and FLAIR hyperintensity. No associated hemorrhage or mass effect. Superimposed chronic lacunar infarcts centered about the left caudothalamic groove. Several associated tiny chronic micro hemorrhages in the region. Patchy bilateral cerebral white matter T2 and FLAIR hyperintensity, mostly periventricular. Mild superimposed bilateral basal ganglia T2 heterogeneity appears in part due to perivascular spaces, no right side deep gray matter lacune identified, although there are multiple chronic brainstem lacunar infarcts, affecting the lower midbrain and pons greater on the right. Cerebellum within normal limits. No other chronic cerebral blood products. No cortical encephalomalacia identified. Cavum septum pellucidum, normal variant. No other restricted diffusion. No midline shift, mass effect, evidence of mass lesion, ventriculomegaly, extra-axial collection or acute intracranial hemorrhage. Cervicomedullary junction within normal limits. Anterior pituitary region T2 hyperintense lesion measuring 9 mm is probably a cyst without enhancement. No suprasellar mass or mass  effect. No abnormal enhancement identified. Vascular: Major intracranial vascular flow voids are preserved. Skull and upper cervical spine: Negative. Normal bone marrow signal. Sinuses/Orbits: Orbits soft tissues are within normal limits. Trace right maxillary sinus mucosal thickening, other paranasal sinuses are clear. Other: Mild right petrous apex air cell fluid appears inconsequential. Mastoids are clear. Visible internal auditory structures appear normal. Negative scalp soft tissues. IMPRESSION: 1. CT finding yesterday corresponds to an acute lacunar infarct extending from the left posterior left corona radiata through to the left external capsule and posterior left lentiform. No associated hemorrhage or mass effect. 2. 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 3. 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 Electronically Signed   By: Odessa Fleming M.D.   On: 11/22/2016 09:33   Dg Chest Port 1 View  Result Date: 11/23/2016 CLINICAL DATA:  Shortness of breath and chest pain EXAM: PORTABLE CHEST 1 VIEW COMPARISON:  November 22, 2016 FINDINGS: There is mild atelectatic change in the bases, essentially stable on the right and less apparent on the left. Lungs elsewhere are clear. Heart is normal in size and contour. Pulmonary vascularity appears normal. No adenopathy. No bone lesions. IMPRESSION: Bibasilar atelectatic change, stable on the right and less apparent on the left compared to 1 day prior. No new opacity. Stable cardiac silhouette. Electronically Signed   By: Bretta Bang III M.D.   On: 11/23/2016 07:33   Portable Chest 1 View  Result Date: 11/22/2016 CLINICAL DATA:  Chest pain. EXAM: PORTABLE CHEST 1 VIEW COMPARISON:  One-view chest x-ray 11/21/2016 FINDINGS: The heart size is normal. There slight increase in linear airspace disease at the lung bases bilaterally.  Lung volumes are low. There is no edema or effusion. No other significant airspace consolidation is present. IMPRESSION: Increasing linear airspace disease at both lung bases likely reflects atelectasis in either the lower or middle lobe on the right and lower lobe or lingula on the left. Infection is considered less likely. Electronically Signed   By: Marin Roberts M.D.   On: 11/22/2016 08:06     Assessment/Plan: Diagnosis: Left lacunar infarct affecting the corona  radiata/external capsule/left lentiform 1. Does the need for close, 24 hr/day medical supervision in concert with the patient's rehab needs make it unreasonable for this patient to be served in a less intensive setting? Yes 2. 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 Due to bladder management, bowel management, safety, skin/wound care, disease management, medication administration, pain management and patient education, does the patient require 24 hr/day rehab nursing? Yes 4. Does the patient require coordinated care of a physician, rehab nurse, PT (1-2 hrs/day, 5 days/week), OT (1-2 hrs/day, 5 days/week) and SLP (1-2 hrs/day, 5 days/week) to address physical and functional deficits in the context of the above medical diagnosis(es)? Yes Addressing deficits in the following areas: balance, endurance, locomotion, strength, transferring, bowel/bladder control, bathing, dressing, feeding, grooming, toileting, speech and psychosocial support 5. Can the patient actively participate in an intensive therapy program of at least 3 hrs of therapy per day at least 5 days per week? Yes 6. The potential for patient to make measurable gains while on inpatient rehab is excellent 7. Anticipated functional outcomes upon discharge from inpatient rehab are modified independent  with PT, modified independent with OT, modified independent with SLP. 8. Estimated rehab length of stay to reach the above  functional goals is: 7-12 days 9. Does the patient have adequate social supports and living environment to accommodate these discharge functional goals? Yes 10. Anticipated D/C setting: Home 11. Anticipated post D/C treatments: HH therapy and Outpatient therapy 12. Overall Rehab/Functional Prognosis: excellent  RECOMMENDATIONS: This patient's condition is appropriate for continued rehabilitative care in the following setting: CIR Patient has agreed to participate in recommended program. Yes Note that insurance prior authorization may be required for reimbursement for recommended care.  Comment: Rehab Admissions Coordinator to follow up.  Thanks,  Ranelle Oyster, MD, [place] [facility]    Charlton Amor., PA-C 11/24/2016    Revision History                        Routing History

## 2016-11-27 NOTE — Progress Notes (Signed)
Standley Brooking, RN Rehab Admission Coordinator Signed Physical Medicine and Rehabilitation  PMR Pre-admission Date of Service: 11/26/2016 12:09 PM  Related encounter: ED to Hosp-Admission (Discharged) from 11/21/2016 in [place] 2H CARDIOVASCULAR ICU       Hide copied text PMR Admission Coordinator Pre-Admission Assessment  Patient: Alessandro Griep is an 61 y.o., male MRN: [id number] DOB: 16-Feb-1956 Height:  (180.3 cm) Weight: 96.6 kg (213 lb)                                                                                                                                                  Insurance Information HMO:     PPO: yes     PCP:      IPA:      80/20:      OTHER:  PRIMARY: [employer] [place]      Policy#: [id number]      Subscriber: pt CM Name: Yetta Glassman      Phone#: [phone]     Fax#: [phone] Pre-Cert#: [id number] approved until 12/10/16 when updates are due      Employer: [employer] Benefits:  Phone #: [phone]     Name: 11/25/16 Eff. Date: 05/02/2016     Deduct: $1500      Out of Pocket Max: $4000      Life Max: none CIR: $250 co pay per admit then insurance covers 90%      SNF: 90% 60 days Outpatient: $25 co pay per visit     Co-Pay: 30 visits each Home Health: 90%      Co-Pay: 60 visits DME: 70%     Co-Pay: 30% Providers: in network  SECONDARY: none       Medicaid Application Date:       Case Manager:  Disability Application Date:       Case Worker:   Emergency Contact Information        Contact Information    Name Relation Home Work Mobile   [place] Spouse [phone]  [phone]     Current Medical History  Patient Admitting Diagnosis: left lacunar infarct   History of Present Illness: HPI: Donovan Persley an 61 y.o.malewith h/o of HTN, polysubstance abuse, medical noncompliance (no meds X 6 months) who was admitted on 11/21/16 with complaints of abdominal pain and back pain. 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 He was started on cardene drip and morphine with resolution of symptoms. On 4/23 am, he developed acute onset of right facial weakness, slurred speech and RUE weakness. CT head negative . 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 CTA head/neck revealed advanced  atheromatous narrowing of L-VA at origin, advanced right MCA bifurcation stenosis and no evidence of progression of intramural hematoma. 2 D echo with EF 55-60% and no wall abnormality.  Dr. Roda Shutters felt that stroke embolic due to aortic origin or thrombotic due to SVD and recommended ASA for secondary stroke prevention. Blood pressures better controlled and he has been slowly weaned off IV labetalol with titration of BP medications. Follow up CT abdomen pending on 11/26/2016.  He reported difficulty swallowing and esophagram done showing mild esophageal dysmotility with delayed passage of barium tablet. Patient with resultant RUE>RLE weakness with unsteady gait, tendency to lean to the right, impulsivity and lacks awareness of deficits.    Past Medical History      Past Medical History:  Diagnosis Date  . Hypertension     Family History  family history includes Stroke in his daughter, mother, and sister.  Prior Rehab/Hospitalizations:  Has the patient had major surgery during 100 days prior to admission? No  Current Medications   Current Facility-Administered Medications:  .  0.9 %  sodium chloride infusion, , Intravenous, Continuous, Kerin Perna, MD, Stopped at 11/25/16 1900 .  acetaminophen (TYLENOL) tablet 650 mg, 650 mg, Oral, Q6H PRN, 650 mg at 11/23/16 1206 **OR** acetaminophen (TYLENOL) suppository 650 mg, 650 mg, Rectal, Q6H PRN,  Kerin Perna, MD .  amLODipine (NORVASC) tablet 5 mg, 5 mg, Oral, Daily, Kerin Perna, MD, 5 mg at 11/26/16 [phone] .  aspirin chewable tablet 81 mg, 81 mg, Oral, Daily, Kerin Perna, MD, 81 mg at 11/26/16 2130 .  atorvastatin (LIPITOR) tablet 40 mg, 40 mg, Oral, q1800, Marvel Plan, MD, 40 mg at 11/26/16 1757 .  cloNIDine (CATAPRES - Dosed in mg/24 hr) patch 0.2 mg, 0.2 mg, Transdermal, Weekly, Kerin Perna, MD, 0.2 mg at 11/26/16 0942 .  fentaNYL (SUBLIMAZE) injection 50 mcg, 50 mcg, Intravenous, Q8H PRN, Kerin Perna, MD .  furosemide (LASIX) tablet 40 mg, 40 mg, Oral, Daily, Kerin Perna, MD, 40 mg at 11/26/16 0852 .  gi cocktail (Maalox,Lidocaine,Donnatal), 30 mL, Oral, Once, Gerhard Munch, MD, Stopped at 11/21/16 1200 .  labetalol (NORMODYNE,TRANDATE) injection 10 mg, 10 mg, Intravenous, Q2H PRN, Kerin Perna, MD, 10 mg at 11/26/16 0853 .  losartan (COZAAR) tablet 150 mg, 150 mg, Oral, Daily, Kerin Perna, MD, 150 mg at 11/26/16 8657 .  magnesium hydroxide (MILK OF MAGNESIA) suspension 30 mL, 30 mL, Oral, Daily PRN, Kerin Perna, MD .  metoprolol (LOPRESSOR) tablet 100 mg, 100 mg, Oral, BID, Kerin Perna, MD, 100 mg at 11/26/16 2153 .  multivitamin with minerals tablet 1 tablet, 1 tablet, Oral, Daily, Kerin Perna, MD, 1 tablet at 11/26/16 [phone] .  ondansetron (ZOFRAN) tablet 4 mg, 4 mg, Oral, Q6H PRN **OR** ondansetron (ZOFRAN) injection 4 mg, 4 mg, Intravenous, Q6H PRN, Kerin Perna, MD .  pantoprazole (PROTONIX) EC tablet 40 mg, 40 mg, Oral, BID, Kerin Perna, MD, 40 mg at 11/26/16 2153 .  simethicone (MYLICON) chewable tablet 80 mg, 80 mg, Oral, QID, Kerin Perna, MD, 80 mg at 11/26/16 2153 .  sorbitol 70 % solution 30 mL, 30 mL, Oral, Daily PRN, Kerin Perna, MD .  traMADol Janean Sark) tablet 50 mg, 50 mg, Oral, Q6H PRN, Kerin Perna, MD  Patients Current Diet: Diet Heart Room service appropriate? Yes; Fluid consistency: Thin  Precautions /  Restrictions Precautions Precautions: Fall Restrictions Weight Bearing Restrictions: No   Has the patient  had 2 or more falls or a fall with injury in the past year?No  Prior Activity Level Community (5-7x/wk): Independent and driving; worked night shift at [employer].   Home Assistive Devices / Equipment Home Assistive Devices/Equipment: Dentures (specify type), Eyeglasses Home Equipment: None  Prior Device Use: Indicate devices/aids used by the patient prior to current illness, exacerbation or injury? None of the above  Prior Functional Level Prior Function Level of Independence: Independent 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, product/process development, drives  Self Care: Did the patient need help bathing, dressing, using the toilet or eating?  Independent  Indoor Mobility: Did the patient need assistance with walking from room to room (with or without device)? Independent  Stairs: Did the patient need assistance with internal or external stairs (with or without device)? Independent  Functional Cognition: Did the patient need help planning regular tasks such as shopping or remembering to take medications? Independent  Current Functional Level Cognition  Overall Cognitive Status: Impaired/Different from baseline Orientation Level: Oriented X4 Safety/Judgement: Decreased awareness of deficits General Comments: pt very particular    Extremity Assessment (includes Sensation/Coordination)  Upper Extremity Assessment: RUE deficits/detail RUE Deficits / Details: 3/5 throughout. except wrist and hand at 2+/5 RUE Coordination: decreased fine motor, decreased gross motor  Lower Extremity Assessment: RLE deficits/detail RLE Deficits / Details: grossly 4/5    ADLs  Overall ADL's : Needs assistance/impaired Eating/Feeding: Set up, Sitting Eating/Feeding Details (indicate cue type and reason): issued pt foam build up for self feeding with R UE Grooming: Wash/dry face, Sitting, Minimal  assistance, Wash/dry hands Upper Body Bathing: Moderate assistance, Sitting Lower Body Bathing: Total assistance, Sit to/from stand Upper Body Dressing : Minimal assistance, Sitting Upper Body Dressing Details (indicate cue type and reason): began instruction in hemi techniques for UE dressing, donned and doffed front opening gown Lower Body Dressing: Minimal assistance, Sitting/lateral leans Lower Body Dressing Details (indicate cue type and reason): donned and doffed socks with min assist, using R UE  Functional mobility during ADLs: +2 for physical assistance, Moderate assistance, Rolling walker, Cueing for safety (able to maintain R hand on walker with intermittent assist)    Mobility  Overal bed mobility: Needs Assistance Bed Mobility: Supine to Sit, Sit to Supine Supine to sit: Supervision, HOB elevated Sit to supine: Min guard General bed mobility comments: no physical assist required    Transfers  Overall transfer level: Needs assistance Equipment used: Rolling walker (2 wheeled) Transfers: Sit to/from Stand Sit to Stand: Min assist General transfer comment: cues for safety, assist for balance    Ambulation / Gait / Stairs / Wheelchair Mobility  Ambulation/Gait Ambulation/Gait assistance: Min assist, Mod assist Ambulation Distance (Feet): 80 Feet (x 2) Assistive device: Rolling walker (2 wheeled) Gait Pattern/deviations: Step-through pattern, Decreased stride length, Wide base of support, Drifts right/left, Decreased weight shift to left General Gait Details: assist for balance, leans to R and esp with turns needing increased assist for safety, assist for R hand on walker Gait velocity: slow Gait velocity interpretation: <1.8 ft/sec, indicative of risk for recurrent falls    Posture / Balance Dynamic Sitting Balance Sitting balance - Comments: no LOB with donning socks Balance Overall balance assessment: Needs assistance Sitting balance-Leahy Scale: Good Sitting  balance - Comments: no LOB with donning socks Standing balance support: Single extremity supported, During functional activity Standing balance-Leahy Scale: Poor Standing balance comment: requires B UE in static standing    Special needs/care consideration BiPAP/CPAP  N/a CPM  N/a Continuous Drip IV  N/a Dialysis  N/a Life Vest  N/a Oxygen  N/a Special Bed  N/a Trach Size   N/a Wound Vac n/a Skin  Moisture associated area to groin Bowel mgmt: continent LBM 11/24/16 Bladder mgmt: continent Diabetic mgmt  N/a   Previous Home Environment Living Arrangements: Alone  Lives With: Alone (seperated for 8 years from his wife; no children) Available Help at Discharge: Other (Comment) (wife to assist as needed at d/c) Type of Home: Apartment Home Layout: One level Home Access: Stairs to enter Entrance Stairs-Rails: None Entrance Stairs-Number of Steps: 2 Bathroom Shower/Tub: Tub/shower unit, Buyer, retail: Yes How Accessible: Accessible via walker Home Care Services: No  Discharge Living Setting Plans for Discharge Living Setting: Lives with (comment) (will either go to his home or go to his wife's home at d/c d) Type of Home at Discharge: Apartment Discharge Home Layout: One level Discharge Home Access: Stairs to enter Entrance Stairs-Rails: None Entrance Stairs-Number of Steps: 2 Discharge Bathroom Shower/Tub: Tub/shower unit, Curtain Discharge Bathroom Toilet: Standard Discharge Bathroom Accessibility: Yes How Accessible: Accessible via walker Does the patient have any problems obtaining your medications?:  (pt admits not taking his BP meds for 6 months)  Social/Family/Support Systems Patient Roles: Spouse, Other (Comment) (fulltime emplyee; 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 He plans to return to his home is functionally ab;e, if not he will d/c home with his wife. She works second shift.  Goals/Additional Needs Patient/Family Goal for Rehab: Mod I with PT, OT, and SLP Expected length of stay: [facility] 7-12 days Pt/Family Agrees to Admission and willing to participate: Yes Program Orientation Provided & Reviewed with Pt/Caregiver Including Roles  & Responsibilities: Yes  Decrease burden of Care through IP rehab admission: n/a  Possible need for SNF placement upon discharge: not anticipated  Patient Condition: This patient's medical and functional status has changed since the consult dated: 11/24/2016 in which the Rehabilitation Physician determined and documented that the patient's condition is appropriate for intensive rehabilitative care in an inpatient rehabilitation facility. See "History of Present Illness" (above) for medical update. Functional changes are: overall min to mod assist Patient's medical and functional status update has been discussed with the Rehabilitation physician and patient remains appropriate for inpatient rehabilitation. Will admit to inpatient rehab today.  Preadmission Screen Completed By:  Clois Dupes, 11/27/2016 7:30 AM ______________________________________________________________________   Discussed status with Dr. Wynn Banker on 11/27/2016 at 0730 and received telephone approval for admission today.  Admission Coordinator:  Clois Dupes, time 4098 Date 11/27/2016       Cosigned by: Erick Colace, MD at 11/27/2016 7:45 AM    Revision History

## 2016-11-27 NOTE — Progress Notes (Signed)
  Subjective:  No complaints  Objective: Vital signs in last 24 hours: Temp:  [98 F (36.7 C)-98.5 F (36.9 C)] 98.5 F (36.9 C) (04/28 0730) Pulse Rate:  [27-78] 27 (04/28 0600) Cardiac Rhythm: Normal sinus rhythm (04/28 0600) Resp:  [0-26] 21 (04/28 0700) BP: (103-133)/(56-82) 116/81 (04/28 0400) SpO2:  [86 %-100 %] 94 % (04/28 0600)  Hemodynamic parameters for last 24 hours:    Intake/Output from previous day: 04/27 0701 - 04/28 0700 In: 532 [P.O.:480; I.V.:52] Out: 1225 [Urine:1225] Intake/Output this shift: No intake/output data recorded.  General appearance: alert and cooperative Neurologic: right sided weakness Heart: regular rate and rhythm, S1, S2 normal, no murmur, click, rub or gallop Lungs: clear to auscultation bilaterally Abdomen: soft, non-tender; bowel sounds normal; no masses,  no organomegaly Extremities: extremities normal, atraumatic, no cyanosis or edema  Lab Results:  Recent Labs  11/26/16 0648  WBC 8.8  HGB 14.6  HCT 43.5  PLT 178   BMET:  Recent Labs  11/26/16 0648  NA 137  K 4.1  CL 104  CO2 24  GLUCOSE 102*  BUN 17  CREATININE 1.06  CALCIUM 8.9    PT/INR: No results for input(s): LABPROT, INR in the last 72 hours. ABG No results found for: PHART, HCO3, TCO2, ACIDBASEDEF, O2SAT CBG (last 3)  No results for input(s): GLUCAP in the last 72 hours.  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  BP is under good control on current regimen. Plan transfer to CIR today for further rehab.  LOS: 6 days    Alleen Borne 11/27/2016

## 2016-11-27 NOTE — Progress Notes (Signed)
Pt to be admitted to inpt rehab today. I will make the arrangements. [phone]

## 2016-11-27 NOTE — Plan of Care (Signed)
Problem: Health Behavior/Discharge Planning: Goal: Ability to manage health-related needs will improve Outcome: Progressing Pt working towards optimal function with PT/OT. Scheduled to transfer to inpatient rehab. Pt able to void without s/s of retention.  Problem: Tissue Perfusion: Goal: Complications of Ischemic Stroke will be minimized (choose ONE based on patient diagnosis) Outcome: Progressing Pt still exhibit Rt facial droop, however improving in use of Rt UE with anti-gravity strength.

## 2016-11-28 ENCOUNTER — Inpatient Hospital Stay (HOSPITAL_COMMUNITY): Payer: BLUE CROSS/BLUE SHIELD | Admitting: Physical Therapy

## 2016-11-28 ENCOUNTER — Inpatient Hospital Stay (HOSPITAL_COMMUNITY): Payer: BLUE CROSS/BLUE SHIELD

## 2016-11-28 DIAGNOSIS — I639 Cerebral infarction, unspecified: Secondary | ICD-10-CM

## 2016-11-28 DIAGNOSIS — G8191 Hemiplegia, unspecified affecting right dominant side: Secondary | ICD-10-CM

## 2016-11-28 DIAGNOSIS — I6381 Other cerebral infarction due to occlusion or stenosis of small artery: Secondary | ICD-10-CM

## 2016-11-28 LAB — COMPREHENSIVE METABOLIC PANEL
ALK PHOS: 63 U/L (ref 38–126)
ALT: 36 U/L (ref 17–63)
AST: 26 U/L (ref 15–41)
Albumin: 3.2 g/dL — ABNORMAL LOW (ref 3.5–5.0)
Anion gap: 9 (ref 5–15)
BUN: 28 mg/dL — ABNORMAL HIGH (ref 6–20)
CO2: 24 mmol/L (ref 22–32)
CREATININE: 1.39 mg/dL — AB (ref 0.61–1.24)
Calcium: 9.1 mg/dL (ref 8.9–10.3)
Chloride: 103 mmol/L (ref 101–111)
GFR, EST NON AFRICAN AMERICAN: 54 mL/min — AB (ref >60)
Glucose, Bld: 105 mg/dL — ABNORMAL HIGH (ref 65–99)
Potassium: 4.2 mmol/L (ref 3.5–5.1)
SODIUM: 136 mmol/L (ref 135–145)
Total Bilirubin: 0.5 mg/dL (ref 0.3–1.2)
Total Protein: 6.2 g/dL — ABNORMAL LOW (ref 6.5–8.1)

## 2016-11-28 LAB — CBC WITH DIFFERENTIAL/PLATELET
Basophils Absolute: 0 10*3/uL (ref 0.0–0.1)
Basophils Relative: 0 %
EOS ABS: 0.3 10*3/uL (ref 0.0–0.7)
EOS PCT: 3 %
HCT: 41.8 % (ref 39.0–52.0)
HEMOGLOBIN: 14 g/dL (ref 13.0–17.0)
Lymphocytes Relative: 27 %
Lymphs Abs: 2.4 10*3/uL (ref 0.7–4.0)
MCH: 29.5 pg (ref 26.0–34.0)
MCHC: 33.5 g/dL (ref 30.0–36.0)
MCV: 88 fL (ref 78.0–100.0)
MONOS PCT: 9 %
Monocytes Absolute: 0.8 10*3/uL (ref 0.1–1.0)
Neutro Abs: 5.2 10*3/uL (ref 1.7–7.7)
Neutrophils Relative %: 61 %
Platelets: 190 10*3/uL (ref 150–400)
RBC: 4.75 MIL/uL (ref 4.22–5.81)
RDW: 13.5 % (ref 11.5–15.5)
WBC: 8.6 10*3/uL (ref 4.0–10.5)

## 2016-11-28 NOTE — Evaluation (Signed)
Physical Therapy Assessment and Plan  Patient Details  Name: Jordan Mcintosh MRN: [id number] Date of Birth: [birth year]  PT Diagnosis: Abnormality of gait, Coordination disorder, Difficulty walking, Hemiplegia dominant, Muscle weakness and Pain in RUE/RLE Rehab Potential: Excellent ELOS: 7-10 days   Today's Date: 11/28/2016 PT Individual Time: 1303-1430 and 1630-1656 PT Individual Time Calculation (min): 87 min and 26   Problem List:  Patient Active Problem List   Diagnosis Date Noted  . Left sided lacunar infarction ([place]) 11/28/2016  . Embolic stroke ([place]) 70/96/2836  . Cerebral thrombosis with cerebral infarction 11/22/2016  . Intramural aortic hematoma ([place]) 11/21/2016    Past Medical History:  Past Medical History:  Diagnosis Date  . Hypertension    Past Surgical History:  Past Surgical History:  Procedure Laterality Date  . INGUINAL HERNIA REPAIR    . UMBILICAL HERNIA REPAIR      Assessment & Plan Clinical Impression: Patient is a 61 y.o. year old malewith h/o of HTN, polysubstance abuse, medical noncompliance (no meds X 9 months) who was admitted on 11/21/16 with complaints of abdominal pain and back pain. 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 He was started on cardene drip and morphine with resolution of symptoms. On 4/23 am, he developed acute onset of right facial weakness, slurred speech and RUE weakness. CT head negative . 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  2 D echo with EF 55-60% and no wall  abnormality.  Dr. Erlinda Hong felt that stroke embolic due to aortic origin or thrombotic due to SVD and recommended ASA for secondary stroke prevention. Blood pressures better controlled and he has been slowly weaned off  IV labetalol with titration of BP medications.  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  Abdomen. And plans to repeat CTA in a week?  Patient indicated difficulty swallowing and esophagram done showing mild esophageal dysmotility with delayed passage of barium tablet. Patient with resultant RUE> RLE weakness with unsteady gait, tendency to lean to the right, impulsivity and lacks awareness of deficits. CIR recommended for follow up therapy. Patient transferred to CIR on 11/27/2016 .   Patient currently requires supervision/min assist with mobility secondary to muscle weakness, decreased cardiorespiratoy endurance, decreased coordination, decreased problem solving, decreased safety awareness and delayed processing, and decreased standing balance, decreased postural control, hemiplegia and decreased balance strategies.  Prior to hospitalization, patient was independent  with mobility and lived with Alone in a [place] home.  Home access is 4Stairs to enter.  Patient will benefit from skilled PT intervention to maximize safe functional mobility, minimize fall risk and decrease caregiver burden for planned discharge home with intermittent assist.  Anticipate patient will benefit from follow up OP at discharge.  PT - End of Session Activity Tolerance: Tolerates 30+ min activity with multiple rests Endurance Deficit: Yes Endurance Deficit Description: 2/2 fatigue PT Assessment Rehab Potential (ACUTE/IP ONLY): Excellent PT Patient demonstrates impairments in the following area(s): Balance;Endurance;Motor;Safety;Pain;Behavior PT Transfers Functional Problem(s): Bed Mobility;Bed to  Chair;Car PT Locomotion Functional Problem(s): Ambulation;Stairs PT Plan PT  Intensity: Minimum of 1-2 x/day ,45 to 90 minutes PT Frequency: 5 out of 7 days PT Duration Estimated Length of Stay: 7-10 days PT Treatment/Interventions: Ambulation/gait training;Community reintegration;DME/adaptive equipment instruction;Neuromuscular re-education;Psychosocial support;Stair training;UE/LE Strength taining/ROM;UE/LE Coordination activities;Therapeutic Activities;Skin care/wound management;Pain management;Functional electrical stimulation;Discharge planning;Balance/vestibular training;Cognitive remediation/compensation;Disease management/prevention;Functional mobility training;Patient/family education;Splinting/orthotics;Therapeutic Exercise;Visual/perceptual remediation/compensation PT Transfers Anticipated Outcome(s): mod I PT Locomotion Anticipated Outcome(s): supervision for stairs without rails, otherwise mod I PT Recommendation Recommendations for Other Services: Speech consult;Therapeutic Recreation consult Therapeutic Recreation Interventions: Pet therapy;Outing/community reintergration Follow Up Recommendations: Outpatient PT Patient destination: Home Equipment Recommended:  (none anticipated at this time)  Skilled Therapeutic Intervention Treatment 1: Patient received in recliner with family presenting who exited room upon therapist's arrival. Pt reported pain R upper back - RN made aware & meds administered. Pt required supervision for standing balance while urinating in bathroom & performing hand hygiene at sink. Pt ambulated over even & uneven surface, ramp, and performed transfers (sit<>stand and car) with supervision overall without AD. Pt required min assist to negotiate 4 steps (6") without rails. Pt completed Berg Balance Test & scored [id number]; educated pt on interpretation of score & current fall risk. Patient demonstrates increased fall risk as noted by score of 44/56 on Berg Balance Scale.   (<36= high risk for falls, close to 100%; 37-45 significant >80%; 46-51 moderate >50%; 52-55 lower >25%). Pt also completed normal, manual, & cognitive TUG tests -- please see below for results. Educated pt on safety plan when in room, weekly interdisciplinary team meeting, ELOS, and daily therapy schedule. At end of session pt left in bed in room with all needs within reach & bed alarm set.  Treatment 2: Pt received in recliner & agreeable to tx; pt denied c/o pain. Pt ambulated room<>gym without AD & supervision with decreased gait speed, impaired step length & foot clearance RLE. Pt utilized nu-step on level 2 x 10 minutes with all four extremities with task focusing on coordination of reciprocal movements & endurance training. Pt reported 13 on Borg RPE scale during task & demonstrated difficulty grasping handle with RUE. Pt performed lateral step ups on single 6" steps with and without UE support with supervision<>min assist for balance. Task focused on RLE strengthening & neuro re-ed. At end of session pt returned to recliner in room & was left with all needs within reach & set up with meal tray.   PT Evaluation Precautions/Restrictions Precautions Precautions: Fall Restrictions Weight Bearing Restrictions: No  General Chart Reviewed: Yes Additional Pertinent History: HTN, polysubstance abuse Response to Previous Treatment: Patient with no complaints from previous session. Family/Caregiver Present: No  Vital Signs Therapy Vitals Pulse Rate: 71 BP: 119/61 (LUE) Patient Position (if appropriate): Sitting Oxygen Therapy SpO2: 100 % O2 Device: Not Delivered Pulse Oximetry Type: Intermittent  Pain Pain Assessment Pain Assessment:  (reported pain in RUE/RLE but denied therapist requesting pain medication from RN)  Home Living/Prior Functioning Home Living Available Help at Discharge: Family (wife (separated for past 8 years) - pt plans to go to her house after d/c from CIR) Type of  Home: Apartment Home Access: Stairs to enter Technical brewer of Steps: 4 Entrance Stairs-Rails: None (friend can install rails if needed) Home Layout: One level Bathroom Shower/Tub: Chiropodist: Standard Bathroom Accessibility: Yes  Lives With: Alone Prior Function Level of Independence: Independent with basic ADLs;Independent with gait;Independent with transfers (does not cook, gets fast food instead)  Able to Take Stairs?: Yes Driving: Yes Vocation: Full time employment 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 Leisure: Hobbies-no  Vision/Perception  Vision - History Baseline Vision: Wears glasses all the time (for driving, reading, working) Environmental health practitioner History: Cataracts (pt self reports he has cataracts but they are not severe enough to have them removed) Patient Visual Report: No change from baseline Perception Perception: Not tested   Cognition Orientation Level: Oriented X4 Safety/Judgment: Impaired  Sensation Sensation Light Touch: Appears Intact (BLE) Stereognosis: Appears Intact Proprioception: Appears Intact (BLE) Coordination Gross Motor Movements are Fluid and Coordinated: No Fine Motor Movements are Fluid and Coordinated: No (impaired finger to nose & heel to shin RUE/LE)  Motor  Motor Motor: Hemiplegia (R side, general weakness)  Mobility Bed Mobility Bed Mobility: Rolling Right;Rolling Left;Supine to Sit;Sit to Supine Rolling Right: 5: Supervision Rolling Left: 5: Supervision Supine to Sit: 5: Supervision Sit to Supine: 5: Supervision Transfers Transfers: Yes Sit to Stand: 5: Supervision;With armrests Sit to Stand Details: Verbal cues for sequencing;Verbal cues for precautions/safety;Verbal cues for safe use of DME/AE Stand to Sit: 5: Supervision;With armrests  Locomotion  Ambulation Ambulation: Yes Ambulation/Gait Assistance: 5: Supervision Ambulation Distance (Feet): 150 Feet Assistive  device: None Gait Gait: Yes Gait Pattern: Decreased weight shift to right;Decreased dorsiflexion - right;Decreased step length - right;Decreased stride length, decreased RUE swing Stairs / Additional Locomotion Stairs: Yes Stairs Assistance: 4: Min assist Stair Management Technique: No rails (reciprocal pattern) Number of Stairs: 4 Height of Stairs: 6 (inches) Ramp: 5: Supervision (without AD) Wheelchair Mobility Wheelchair Mobility: No   Balance Balance Balance Assessed: Yes Standardized Balance Assessment Standardized Balance Assessment: Berg Balance Test;Timed Up and Go Test Berg Balance Test Sit to Stand: Able to stand without using hands and stabilize independently Standing Unsupported: Able to stand 2 minutes with supervision Sitting with Back Unsupported but Feet Supported on Floor or Stool: Able to sit safely and securely 2 minutes Stand to Sit: Sits safely with minimal use of hands Transfers: Able to transfer safely, minor use of hands Standing Unsupported with Eyes Closed: Able to stand 10 seconds with supervision Standing Ubsupported with Feet Together: Able to place feet together independently and stand for 1 minute with supervision From Standing, Reach Forward with Outstretched Arm: Can reach confidently >25 cm (10") From Standing Position, Pick up Object from Floor: Able to pick up shoe, needs supervision From Standing Position, Turn to Look Behind Over each Shoulder: Looks behind from both sides and weight shifts well Turn 360 Degrees: Able to turn 360 degrees safely but slowly Standing Unsupported, Alternately Place Feet on Step/Stool: Able to complete 4 steps without aid or supervision (completes all 8 steps in 12 seconds but requires supervision) Standing Unsupported, One Foot in Front: Able to take small step independently and hold 30 seconds Standing on One Leg: Able to lift leg independently and hold equal to or more than 3 seconds (maintains balance on  unaffected LLE for 3 seconds) Total Score: 44  Timed Up and Go Test TUG: Normal TUG;Manual TUG;Cognitive TUG Normal TUG (seconds): 9.41 Manual TUG (seconds): 8.73 Cognitive TUG (seconds): 10.23  Extremity Assessment  RLE Assessment RLE Assessment:  (ROM WFL, 2/5 ankle dorsiflexion) LLE Assessment LLE Assessment: Within Functional Limits   See Function Navigator for Current Functional Status.   Refer to Care Plan for Long Term Goals  Recommendations for other services: Therapeutic Recreation  Pet therapy and Outing/community reintegration  Discharge Criteria: Patient will be discharged from PT if patient refuses treatment 3 consecutive times without medical reason, if treatment goals not met, if  there is a change in medical status, if patient makes no progress towards goals or if patient is discharged from hospital.  The above assessment, treatment plan, treatment alternatives and goals were discussed and mutually agreed upon: by patient  Waunita Schooner 11/28/2016, 5:03 PM

## 2016-11-28 NOTE — Evaluation (Signed)
Occupational Therapy Assessment and Plan  Patient Details  Name: Jordan Mcintosh MRN: [id number] Date of Birth: June 01, 1956  OT Diagnosis: abnormal posture, cognitive deficits, hemiplegia affecting dominant side and muscle weakness (generalized) Rehab Potential:   ELOS: 7-10 days   Today's Date: 11/28/2016 OT Individual Time: 3295-1884 OT Individual Time Calculation (min): 87 min     Problem List:  Patient Active Problem List   Diagnosis Date Noted  . Left sided lacunar infarction ([place]) 11/28/2016  . Embolic stroke ([place]) 16/60/6301  . Cerebral thrombosis with cerebral infarction 11/22/2016  . Intramural aortic hematoma ([place]) 11/21/2016    Past Medical History:  Past Medical History:  Diagnosis Date  . Hypertension    Past Surgical History:  Past Surgical History:  Procedure Laterality Date  . INGUINAL HERNIA REPAIR    . UMBILICAL HERNIA REPAIR      Assessment & Plan Clinical Impression: Jordan Mcintosh an 61 y.o.malewith h/o of HTN, polysubstance abuse, medical noncompliance (no meds X 9 months) who was admitted on 11/21/16 with complaints of abdominal pain and back pain. 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 He was started on cardene drip and morphine with resolution of symptoms. On 4/23 am, he developed acute onset of right facial weakness, slurred speech and RUE weakness. CT head negative . 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  2 D echo with EF 55-60% and no wall abnormality.  Dr. Erlinda Hong felt that stroke embolic due to  aortic origin or thrombotic due to SVD and recommended ASA for secondary stroke prevention. Blood pressures better controlled and he has been slowly weaned off  IV labetalol with titration of BP medications.  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  Abdomen. And plans to repeat CTA in a week?  Patient indicated difficulty swallowing and esophagram done showing mild esophageal dysmotility with delayed passage of barium tablet. Patient with resultant RUE> RLE weakness with unsteady gait, tendency to lean to the right, impulsivity and lacks awareness of deficits. CIR recommended for follow up therapy.  Patient currently requires min-supervision with basic self-care skills secondary to muscle weakness, decreased cardiorespiratoy endurance, decreased coordination and decreased motor planning and decreased problem solving, decreased safety awareness, decreased memory and delayed processing.  Prior to hospitalization, patient could complete ADLs/IADLs with modified independent .  Patient will benefit from skilled intervention to increase independence with basic self-care skills and increase level of independence with iADL prior to discharge home wiht supervision from wife PRN.  Anticipate patient will require intermittent supervision for higher level IADLs and follow up home health.  OT - End of Session Activity Tolerance: Tolerates 30+ min activity with multiple rests OT Assessment Barriers to Discharge: Decreased caregiver support OT Patient demonstrates impairments in the following area(s): Balance;Cognition;Edema;Endurance;Motor;Safety OT Basic ADL's Functional Problem(s): Eating;Grooming;Bathing;Dressing;Toileting OT Advanced ADL's Functional Problem(s): Simple Meal Preparation OT Transfers Functional Problem(s): Toilet;Tub/Shower OT Additional Impairment(s): Fuctional Use of Upper  Extremity OT Plan OT Intensity: Minimum of 1-2 x/day, 45 to 90 minutes OT Frequency: 5 out of 7 days OT  Duration/Estimated Length of Stay: 7-10 days OT Treatment/Interventions: Balance/vestibular training;Cognitive remediation/compensation;Discharge planning;Community reintegration;DME/adaptive equipment instruction;Functional electrical stimulation;Functional mobility training;Neuromuscular re-education;Patient/family education;Self Care/advanced ADL retraining;Therapeutic Activities;Therapeutic Exercise;UE/LE Coordination activities;Visual/perceptual remediation/compensation OT Self Feeding Anticipated Outcome(s): MOD I OT Basic Self-Care Anticipated Outcome(s): MOD I OT Toileting Anticipated Outcome(s): MOD I OT Bathroom Transfers Anticipated Outcome(s): MOD I OT Recommendation Patient destination: Home Follow Up Recommendations: Home health OT Equipment Recommended: To be determined   Skilled Therapeutic Intervention 1:1 session. Pt educated on OT role/purpose, CIR, goal setting and ELOS. Pt agreeable to bathe and dress at shower level. Pt completes ambulatory walk in shower transfer with MIN HHA for balance with Vc for safety awareness and sequencing transfer. Pt bathes seated on shower chair to wash 10/10 body parts with min A at sit to stand level for balance to wash buttocks and peri area. While seated in recliner pt dons threads BLE into feet with VC for hemi dressing techniques. Pt require A to advance pant up hips d/t pants being right around waist. Pt dons pull over shirt in standing with touching A for balance. Pt dons B socks with VC to assume seated figure four. Pt completes ambulatory transfer onto TTB in tub room with CGA with VC for safe DME use and sequencing. Pt stands at sink to brush teeth with built up red foam handle with RUE with CGA while standing. Pt uses red foam handle on fork to complete hand to mouth excursion with increased time and effort d/t difficulty reaching end  ranges of elbow flexion. Encouraged pt to eat majority of meal with RUE to improve endurance and strength. Exited session with pt seated in recliner with call light in place and RN in room.  OT Evaluation Precautions/Restrictions  Precautions Precautions: Fall Restrictions Weight Bearing Restrictions: No General Chart Reviewed: Yes Vital Signs   Pain Pain Assessment Pain Assessment: No/denies pain Home Living/Prior Functioning Home Living Available Help at Discharge:  (wife available PRN at d/c) Type of Home: Apartment Home Access: Stairs to enter [employer] of Steps: 2 Entrance Stairs-Rails: None Home Layout: One level Bathroom Shower/Tub: Tub/shower unit, Architectural technologist: Standard Bathroom Accessibility: Yes  Lives With: Alone (has adult children and they live locally) IADL History Homemaking Responsibilities: Yes Meal Prep Responsibility: Primary Laundry Responsibility: Primary Cleaning Responsibility: Primary Bill Paying/Finance Responsibility: Primary Shopping Responsibility: Primary Child Care Responsibility: Primary Homemaking Comments: wife available PRN Current License: Yes Mode of Transportation: Car Occupation: Full time employment Type of Occupation: 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 IADL Comments: mainly get takeout for meals Prior Function Level of Independence: Independent with basic ADLs  Able to Take Stairs?: Yes Driving: Yes Vocation: Full time employment ADL   Vision/Perception  Vision- Assessment Eye Alignment:  (R eye oriented up) Additional Comments: has bifocals; pt reports objects moving in vision Perception Perception: Within Functional Limits Praxis Praxis: Intact  Cognition Orientation Level: Person;Place;Situation Person: Oriented Place: Oriented Situation: Oriented Year: 2018 Month:  April Day of Week: Correct Memory: Appears intact Immediate Memory Recall: Sock;Blue;Bed Memory Recall: Blue;Sock;Bed Memory Recall Sock: With Cue Memory Recall Blue: Without Cue Memory Recall Bed: With Cue Attention: Selective Selective Attention:  (frequently distracted by people walking in halls) Sensation Sensation Light Touch: Appears Intact Stereognosis: Appears Intact Coordination Gross Motor Movements are Fluid and Coordinated: No Fine Motor Movements are Fluid and Coordinated: No Coordination and Movement Description: slow  Finger Nose  Finger Test: slow and uncoordinated with RUE, pt misses touching finger  Motor  Motor Motor: Hemiplegia Motor - Skilled Clinical Observations: full AROM of R shoulder, elbow and wrist, mod ranges of digits Mobility  Transfers Transfers: Sit to Stand Sit to Stand: 4: Min assist Sit to Stand Details: Verbal cues for sequencing;Verbal cues for precautions/safety;Verbal cues for safe use of DME/AE  Trunk/Postural Assessment  Cervical Assessment Cervical Assessment: Within Functional Limits Thoracic Assessment Thoracic Assessment: Exceptions to [facility] (rounded shoulders) Lumbar Assessment Lumbar Assessment: Exceptions to [facility] (posterior pelvic tilt) Postural Control Postural Control: Deficits on evaluation (delayed)  Balance Balance Balance Assessed: Yes Dynamic Sitting Balance Dynamic Sitting - Balance Support: No upper extremity supported Dynamic Sitting - Level of Assistance: 6: Modified independent (Device/Increase time) Sitting balance - Comments: no LOB with donning socks Dynamic Standing Balance Dynamic Standing - Balance Support: No upper extremity supported Dynamic Standing - Level of Assistance: 4: Min assist Dynamic Standing - Comments: bathing buttocks/peri area Extremity/Trunk Assessment RUE Assessment RUE Assessment: Exceptions to [facility] (generalized weakness; decreased AROM in digits) LUE Assessment LUE Assessment: Within  Functional Limits (generalized weakness)   See Function Navigator for Current Functional Status.   Refer to Care Plan for Long Term Goals  Recommendations for other services: Therapeutic Recreation  Kitchen group and Outing/community reintegration   Discharge Criteria: Patient will be discharged from OT if patient refuses treatment 3 consecutive times without medical reason, if treatment goals not met, if there is a change in medical status, if patient makes no progress towards goals or if patient is discharged from hospital.  The above assessment, treatment plan, treatment alternatives and goals were discussed and mutually agreed upon: by patient  Tonny Branch 11/28/2016, 12:19 PM

## 2016-11-28 NOTE — Plan of Care (Signed)
Problem: RH Balance Goal: LTG Patient will maintain dynamic standing balance (PT) LTG:  Patient will maintain dynamic standing balance with assistance during mobility activities (PT) With LRAD  Problem: RH Bed to Chair Transfers Goal: LTG Patient will perform bed/chair transfers w/assist (PT) LTG: Patient will perform bed/chair transfers with assistance, with/without cues (PT). With LRAD  Problem: RH Car Transfers Goal: LTG Patient will perform car transfers with assist (PT) LTG: Patient will perform car transfers with assistance (PT). With LRAD  Problem: RH Ambulation Goal: LTG Patient will ambulate in controlled environment (PT) LTG: Patient will ambulate in a controlled environment, # of feet with assistance (PT). 150 ft with LRAD Goal: LTG Patient will ambulate in home environment (PT) LTG: Patient will ambulate in home environment, # of feet with assistance (PT). 50 ft with LRAD  Problem: RH Stairs Goal: LTG Patient will ambulate up and down stairs w/assist (PT) LTG: Patient will ambulate up and down # of stairs with assistance (PT) 2 steps without rails with LRAD

## 2016-11-28 NOTE — H&P (Signed)
Physical Medicine and Rehabilitation Admission H&P    Chief Complaint  Patient presents with  . Right sided weakness with unsteady gait and inattention.   . Periaortic hematoma    HPI: Jordan Mcintosh is an 61 y.o. male with h/o of HTN, polysubstance abuse, medical noncompliance (no meds X 9 months) who was admitted on 11/21/16 with complaints of abdominal pain and back pain. 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 He was started on cardene drip and morphine with resolution of symptoms. On 4/23 am, he developed acute onset of right facial weakness, slurred speech and RUE weakness. CT head negative . 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  2 D echo with EF 55-60% and no wall abnormality.  Dr. Erlinda Hong felt that stroke embolic due to aortic origin or thrombotic due to SVD and recommended ASA for secondary stroke prevention. Blood pressures better controlled and he has been slowly weaned off  IV labetalol with titration of BP medications.  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  Abdomen. And plans to repeat CTA in a week?  Patient indicated difficulty swallowing and esophagram done showing mild esophageal dysmotility with delayed passage of barium tablet. Patient with resultant RUE> RLE weakness with unsteady gait, tendency to lean to the right, impulsivity and lacks awareness of deficits. CIR recommended for follow up therapy.      Review of Systems  HENT: Positive for hearing loss. Negative for tinnitus.   Eyes: Negative for blurred vision and double vision.  Respiratory: Negative for cough and shortness of breath.   Cardiovascular: Negative for chest pain, palpitations and leg swelling.  Gastrointestinal: Negative for abdominal pain, constipation, heartburn and nausea.  Genitourinary: Negative for dysuria and urgency.  Musculoskeletal: Positive for neck pain. Negative for myalgias.  Skin: Negative for itching and rash.  Neurological: Positive for speech change, focal weakness and weakness. Negative for dizziness and headaches.  Psychiatric/Behavioral: Negative for memory loss.      Past Medical History:  Diagnosis Date  . Hypertension     Past Surgical History:  Procedure Laterality Date  . INGUINAL HERNIA REPAIR    . UMBILICAL HERNIA REPAIR       Family History  Problem Relation Age of Onset  . Stroke Mother   . Stroke Sister   . Stroke Daughter      Social History:  Married but has been separated for 7 years but they still see each other. Has two children. He works in a Clinical cyt[employer]ist.   He reports that he has quit smoking 9 years ago. He has never used smokeless tobacco. 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  He reports that quit drinking alcohol 9 years ago.    Allergies  Allergen Reactions  . Codeine Nausea Only    Medications Prior to Admission  Medication Sig Dispense Refill  . acetaminophen (TYLENOL) 325 MG tablet Take 2 tablets (650 mg total) by mouth every  6 (six) hours as needed for mild pain (or Fever >/= 101).    Marland Kitchen amLODipine (NORVASC) 5 MG tablet Take 1 tablet (5 mg total) by mouth daily.    Marland Kitchen aspirin 81 MG chewable tablet Chew 1 tablet (81 mg total) by mouth daily.    Marland Kitchen atorvastatin (LIPITOR) 40 MG tablet Take 1 tablet (40 mg total) by mouth daily at 6 PM.    . [START ON 12/03/2016] cloNIDine  (CATAPRES - DOSED IN MG/24 HR) 0.2 mg/24hr patch Place 1 patch (0.2 mg total) onto the skin once a week. 4 patch 12  . furosemide (LASIX) 40 MG tablet Take 1 tablet (40 mg total) by mouth daily. 30 tablet   . losartan (COZAAR) 50 MG tablet Take 3 tablets (150 mg total) by mouth daily.    . metoprolol (LOPRESSOR) 100 MG tablet Take 1 tablet (100 mg total) by mouth 2 (two) times daily.    . Multiple Vitamin (MULTIVITAMIN WITH MINERALS) TABS tablet Take 1 tablet by mouth daily.    . pantoprazole (PROTONIX) 40 MG tablet Take 1 tablet (40 mg total) by mouth 2 (two) times daily.    . simethicone (MYLICON) 80 MG chewable tablet Chew 1 tablet (80 mg total) by mouth 4 (four) times daily. 30 tablet 0  . traMADol (ULTRAM) 50 MG tablet Take 1 tablet (50 mg total) by mouth every 6 (six) hours as needed for moderate pain. 30 tablet     Home:     Functional History:    Functional Status:  Mobility:          ADL:    Cognition: Cognition Orientation Level: Oriented X4      Blood pressure 129/77, pulse 63, temperature 97.8 F (36.6 C), temperature source Oral, resp. rate 16, height '5\' 11"'  (1.803 m), weight 93.5 kg (206 lb 0.7 oz), SpO2 97 %. Physical Exam  Nursing note and vitals reviewed. Constitutional: He is oriented to person, place, and time. He appears well-developed and well-nourished.  HENT:  Head: Normocephalic and atraumatic.  Eyes: Conjunctivae and EOM are normal. Pupils are equal, round, and reactive to light.  Neck: Normal range of motion. Neck supple.  Cardiovascular: Normal rate and regular rhythm.   Respiratory: Effort normal and breath sounds normal. No stridor.  GI: Soft. Bowel sounds are normal. There is no tenderness.  Musculoskeletal: He exhibits no edema or tenderness.  Neurological: He is alert and oriented to person, place, and time.  Right facial weakness with soft voice, mild dysarthria and noted to have wet voice at times. He is able to follow simple one and  two step commands. RUE weakness.    Skin: Skin is warm and dry.  Psychiatric: He has a normal mood and affect. His behavior is normal. Thought content normal.    Results for orders placed or performed during the hospital encounter of 11/27/16 (from the past 48 hour(s))  CBC WITH DIFFERENTIAL     Status: None   Collection Time: 11/28/16  5:37 AM  Result Value Ref Range   WBC 8.6 4.0 - 10.5 K/uL   RBC 4.75 4.22 - 5.81 MIL/uL   Hemoglobin 14.0 13.0 - 17.0 g/dL   HCT 41.8 39.0 - 52.0 %   MCV 88.0 78.0 - 100.0 fL   MCH 29.5 26.0 - 34.0 pg   MCHC 33.5 30.0 - 36.0 g/dL   RDW 13.5 11.5 - 15.5 %   Platelets 190 150 - 400 K/uL   Neutrophils Relative % 61 %  Neutro Abs 5.2 1.7 - 7.7 K/uL   Lymphocytes Relative 27 %   Lymphs Abs 2.4 0.7 - 4.0 K/uL   Monocytes Relative 9 %   Monocytes Absolute 0.8 0.1 - 1.0 K/uL   Eosinophils Relative 3 %   Eosinophils Absolute 0.3 0.0 - 0.7 K/uL   Basophils Relative 0 %   Basophils Absolute 0.0 0.0 - 0.1 K/uL  Comprehensive metabolic panel     Status: Abnormal   Collection Time: 11/28/16  5:37 AM  Result Value Ref Range   Sodium 136 135 - 145 mmol/L   Potassium 4.2 3.5 - 5.1 mmol/L   Chloride 103 101 - 111 mmol/L   CO2 24 22 - 32 mmol/L   Glucose, Bld 105 (H) 65 - 99 mg/dL   BUN 28 (H) 6 - 20 mg/dL   Creatinine, Ser 1.39 (H) 0.61 - 1.24 mg/dL   Calcium 9.1 8.9 - 10.3 mg/dL   Total Protein 6.2 (L) 6.5 - 8.1 g/dL   Albumin 3.2 (L) 3.5 - 5.0 g/dL   AST 26 15 - 41 U/L   ALT 36 17 - 63 U/L   Alkaline Phosphatase 63 38 - 126 U/L   Total Bilirubin 0.5 0.3 - 1.2 mg/dL   GFR calc non Af Amer 54 (L) >60 mL/min   GFR calc Af Amer >60 >60 mL/min    Comment: (NOTE) The eGFR has been calculated using the CKD EPI equation. This calculation has not been validated in all clinical situations. eGFR's persistently <60 mL/min signify possible Chronic Kidney Disease.    Anion gap 9 5 - 15   Ct Angio Chest Pe W Or Wo Contrast  Result Date: 11/26/2016 CLINICAL  DATA:  Followup intramural aortic hematoma. EXAM: CT ANGIOGRAPHY CHEST WITH CONTRAST TECHNIQUE: Multidetector CT imaging of the chest was performed using the standard protocol during bolus administration of intravenous contrast. Multiplanar CT image reconstructions and MIPs were obtained to evaluate the vascular anatomy. CONTRAST:  100 cc Isovue 370 intravenous COMPARISON:  None. FINDINGS: Cardiovascular: Order was entered as a pulmonary embolism protocol and there was no enhancement of the systemic arterial tree. Clinical team declines patient return for additional contrast at this time. There is plan to obtain a CTA follow-up next week. Patient is currently being observed in the ICU. Noncontrast phase of the aorta shows no residual high-density intramural hematoma. There is indistinctness of the perihilar fat just beyond the isthmus, the site of penetrating ulcer, with likely stable appearance on coronal reformats. At the level of the proximal descending segment, aortic diameter measures up to 37 mm, 2 mm larger than before. These measurements were taken on sagittal reformats. No mediastinal hematoma is noted. Negative for pulmonary embolism. Coronary atherosclerotic calcification.  No pericardial effusion. Mediastinum/Nodes: Negative for adenopathy. Small sliding hiatal hernia Lungs/Pleura: There is no edema, consolidation, effusion, or pneumothorax. Upper Abdomen: No acute finding Musculoskeletal: No acute or aggressive finding Review of the MIP images confirms the above findings. IMPRESSION: Noncontrast evaluation of the thoracic aorta. 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 Maximal aortic diameter at the descending segment is 37 mm, 2 mm larger than prior. Electronically Signed   By: Monte Fantasia M.D.   On: 11/26/2016 15:53       Medical Problem List and Plan: 1.  Right hemiparesis and gait disorder  secondary to Left posterior  corona radiata, lentiform and ext capsule infarcts 2.  DVT Prophylaxis/Anticoagulation: Mechanical: Sequential compression devices, below  knee Bilateral lower extremities 3. Pain Management: N/A 4. Mood: LCSW to follow for evaluation and support.  5. Neuropsych: This patient is capable of making decisions on his own behalf. 6. Skin/Wound Care: routine pressure relief measures. He is able to move reposition without difficulty.  7. Fluids/Electrolytes/Nutrition: Monitor I/O. Check lytes in am.  8. Hematoma of descending aorta and penetrating ulcer: BP goals < 140. 9. Uncontrolled HTN: Monitor BP qid. Continue norvasc, lasix, cozaar and metoprolol--catapres added 4/27  10. Dyslipidemia: On Lipitor.  11. Prediabetes: Hgb A1C- 5.9. Will educate patient on appropriate diet.      Post Admission Physician Evaluation: Functional deficits secondary  to . Left posterior corona radiata, lentiform and ext capsule infarcts 1.  2. Patient is admitted to receive collaborative, interdisciplinary care between the physiatrist, rehab nursing staff, and therapy team. 3. Patient's level of medical complexity and substantial therapy needs in context of that medical necessity cannot be provided at a lesser intensity of care such as a SNF. 4. Patient has experienced substantial functional loss from his/her baseline which was documented above under the "Functional History" and "Functional Status" headings.  Judging by the patient's diagnosis, physical exam, and functional history, the patient has potential for functional progress which will result in measurable gains while on inpatient rehab.  These gains will be of substantial and practical use upon discharge  in facilitating mobility and self-care at the household level. 5. Physiatrist will provide 24 hour management of medical needs as well as oversight of the therapy plan/treatment and provide guidance as appropriate regarding the interaction of the two. 6. The  Preadmission Screening has been reviewed and patient status is unchanged unless otherwise stated above. 7. 24 hour rehab nursing will assist with bladder management, bowel management, safety, skin/wound care, disease management, medication administration, pain management and patient education  and help integrate therapy concepts, techniques,education, etc. 8. PT will assess and treat for/with: pre gait, gait training, endurance , safety, equipment, neuromuscular re education.   Goals are: Mod I/sup. 9. OT will assess and treat for/with: ADLs, Cognitive perceptual skills, Neuromuscular re education, safety, endurance, equipment.   Goals are: Mod I /sup. Therapy may proceed with showering this patient. 10. SLP will assess and treat for/with: memory attn, concentration , thought organization.  Goals are: Mod I. 11. Case Management and Social Worker will assess and treat for psychological issues and discharge planning. 12. Team conference will be held weekly to assess progress toward goals and to determine barriers to discharge. 13. Patient will receive at least 3 hours of therapy per day at least 5 days per week. 14. ELOS: 7-10d       15. Prognosis:  excellent     [name] LOVE PA-C Charlett Blake, MD 11/28/2016

## 2016-11-28 NOTE — Plan of Care (Signed)
Problem: RH Stairs Goal: LTG Patient will ambulate up and down stairs w/assist (PT) LTG: Patient will ambulate up and down # of stairs with assistance (PT)  4 steps without rails & LRAD for access to wife's apartment

## 2016-11-29 ENCOUNTER — Inpatient Hospital Stay (HOSPITAL_COMMUNITY): Payer: BLUE CROSS/BLUE SHIELD | Admitting: Occupational Therapy

## 2016-11-29 ENCOUNTER — Inpatient Hospital Stay (HOSPITAL_COMMUNITY): Payer: BLUE CROSS/BLUE SHIELD

## 2016-11-29 ENCOUNTER — Inpatient Hospital Stay (HOSPITAL_COMMUNITY): Payer: BLUE CROSS/BLUE SHIELD | Admitting: Speech Pathology

## 2016-11-29 DIAGNOSIS — Z9119 Patient's noncompliance with other medical treatment and regimen: Secondary | ICD-10-CM

## 2016-11-29 DIAGNOSIS — N179 Acute kidney failure, unspecified: Secondary | ICD-10-CM

## 2016-11-29 DIAGNOSIS — T148XXA Other injury of unspecified body region, initial encounter: Secondary | ICD-10-CM

## 2016-11-29 DIAGNOSIS — E46 Unspecified protein-calorie malnutrition: Secondary | ICD-10-CM

## 2016-11-29 DIAGNOSIS — I6350 Cerebral infarction due to unspecified occlusion or stenosis of unspecified cerebral artery: Secondary | ICD-10-CM

## 2016-11-29 DIAGNOSIS — R7303 Prediabetes: Secondary | ICD-10-CM

## 2016-11-29 DIAGNOSIS — I10 Essential (primary) hypertension: Secondary | ICD-10-CM

## 2016-11-29 DIAGNOSIS — Z91199 Patient's noncompliance with other medical treatment and regimen due to unspecified reason: Secondary | ICD-10-CM

## 2016-11-29 MED ORDER — PRO-STAT SUGAR FREE PO LIQD
30.0000 mL | Freq: Two times a day (BID) | ORAL | Status: DC
  Administered 2016-11-29 – 2016-12-03 (×9): 30 mL via ORAL
  Filled 2016-11-29 (×9): qty 30

## 2016-11-29 NOTE — Progress Notes (Signed)
[facility] PHYSICAL MEDICINE & REHABILITATION     PROGRESS NOTE  Subjective/Complaints:  Pt seen sitting up in his chair this AM.  He slept well overnight after a tiresome day of therapies.    ROS: Denies CP, SOB, N/V/D.  Objective: Vital Signs: Blood pressure 137/66, pulse 81, temperature 97.9 F (36.6 C), temperature source Oral, resp. rate 18, height  (1.803 m), weight 93.5 kg (206 lb 3.5 oz), SpO2 95 %. No results found.  Recent Labs  11/28/16 0537  WBC 8.6  HGB 14.0  HCT 41.8  PLT 190    Recent Labs  11/28/16 0537  NA 136  K 4.2  CL 103  GLUCOSE 105*  BUN 28*  CREATININE 1.39*  CALCIUM 9.1   CBG (last 3)  No results for input(s): GLUCAP in the last 72 hours.  Wt Readings from Last 3 Encounters:  11/29/16 93.5 kg (206 lb 3.5 oz)  11/25/16 96.6 kg (213 lb)    Physical Exam:  BP 137/66   Pulse 81   Temp 97.9 F (36.6 C) (Oral)   Resp 18   Ht  (1.803 m)   Wt 93.5 kg (206 lb 3.5 oz)   SpO2 95%   BMI 28.76 kg/m  Constitutional: He appears well-developed and well-nourished.  HENT: Normocephalic and atraumatic.  Eyes: EOMI. No discharge.  Cardiovascular: Normal rate and regular rhythm.  No JVD. Respiratory: Effort normal and breath sounds normal.  GI: Soft. Bowel sounds are normal.  Musculoskeletal: He exhibits no edema or tenderness.  Neurological: He is alert and oriented x3.  Right facial weakness with mild dysarthria Able to follow simple one and two step commands.  Motor: RUE 4-/5 proximal to distal RLE: 4/5 proximal to distal LUE/LLE: 5/5 proximal to distal  Skin: Skin is warm and dry.  Psychiatric: He has a normal mood and affect.   Assessment/Plan: 1. Functional deficits secondary to Left posterior corona radiata, lentiform and ext capsule infarcts which require 3+ hours per day of interdisciplinary therapy in a comprehensive inpatient rehab setting. Physiatrist is providing close team supervision and 24 hour management of active  medical problems listed below. Physiatrist and rehab team continue to assess barriers to discharge/monitor patient progress toward functional and medical goals.  Function:  Bathing Bathing position   Position: Shower  Bathing parts Body parts bathed by patient: Right arm, Left lower leg, Left arm, Chest, Abdomen, Buttocks, Front perineal area, Right upper leg, Left upper leg, Right lower leg Body parts bathed by helper: Back  Bathing assist Assist Level: Touching or steadying assistance(Pt > 75%)      Upper Body Dressing/Undressing Upper body dressing   What is the patient wearing?: Pull over shirt/dress     Pull over shirt/dress - Perfomed by patient: Thread/unthread right sleeve, Thread/unthread left sleeve, Put head through opening          Upper body assist Assist Level: Touching or steadying assistance(Pt > 75%) (standing)      Lower Body Dressing/Undressing Lower body dressing   What is the patient wearing?: Pants, Socks     Pants- Performed by patient: Thread/unthread right pants leg, Thread/unthread left pants leg, Pull pants up/down Pants- Performed by helper: Pull pants up/down (pants tight)     Socks - Performed by patient: Don/doff right sock, Don/doff left sock                Lower body assist Assist for lower body dressing: Touching or steadying assistance (Pt > 75%)  Toileting Toileting   Toileting steps completed by patient: Adjust clothing prior to toileting, Performs perineal hygiene, Adjust clothing after toileting   Toileting Assistive Devices: Grab bar or rail  Toileting assist Assist level: Supervision or verbal cues   Transfers Chair/bed transfer   Chair/bed transfer method: Ambulatory Chair/bed transfer assist level: Supervision or verbal cues       Locomotion Ambulation     Max distance: 150 ft Assist level: Supervision or verbal cues   Wheelchair          Cognition Comprehension Comprehension assist level:  Understands basic 90% of the time/cues < 10% of the time  Expression Expression assist level: Expresses basic needs/ideas: With no assist  Social Interaction Social Interaction assist level: Interacts appropriately 90% of the time - Needs monitoring or encouragement for participation or interaction.  Problem Solving Problem solving assist level: Solves basic problems with no assist  Memory Memory assist level: Recognizes or recalls 90% of the time/requires cueing < 10% of the time    Medical Problem List and Plan: 1.  Right hemiparesis and gait disorder  secondary to Left posterior corona radiata, lentiform and ext capsule infarcts  Cont CIR  Notes reviewed, imaging reviewed 2.  DVT Prophylaxis/Anticoagulation: Mechanical: Sequential compression devices, below knee Bilateral lower extremities 3. Pain Management: N/A 4. Mood: LCSW to follow for evaluation and support.  5. Neuropsych: This patient is capable of making decisions on his own behalf. 6. Skin/Wound Care: routine pressure relief measures. He is able to move reposition without difficulty.  7. Fluids/Electrolytes/Nutrition: Monitor I/Os.  8. Hematoma of descending periaorta and penetrating ulcer: BP goals < 140.  ?Repeat CT next week? 9. Uncontrolled HTN: Monitor BP qid. Continue norvasc, lasix, cozaar and metoprolol--catapres added 4/27   Controlled, relatively low 4/30  Will consider decrease in meds tomorrow if remains low 10. Dyslipidemia: On Lipitor.  11. Prediabetes: Hgb A1C- 5.9. Educate patient on appropriate diet.   Controlled 4/30 12. Hypoalbuminemia  Supplement initiated 4/30 13. AKI  Cr 1.39 on 4/29  Encourage fluids  Cont to monitor 14. History of noncompliance  Educate  LOS (Days) 2 A FACE TO FACE EVALUATION WAS PERFORMED  Britnay Magnussen Karis Juba 11/29/2016 9:05 AM

## 2016-11-29 NOTE — Evaluation (Signed)
Speech Language Pathology Assessment and Plan  Patient Details  Name: Jordan Mcintosh MRN: [id number] Date of Birth: 06/24/1956  SLP Diagnosis: Dysarthria;Cognitive Impairments  Rehab Potential: Excellent ELOS: 7 to 10 days    Today's Date: 11/29/2016 SLP Individual Time: 1430-1530 SLP Individual Time Calculation (min): 60 min   Problem List:  Patient Active Problem List   Diagnosis Date Noted  . Prediabetes   . Benign essential HTN   . AKI (acute kidney injury) ([place])   . History of noncompliance with medical treatment   . Hypoalbuminemia due to protein-calorie malnutrition ([place])   . Hematoma   . Left sided lacunar infarction ([place]) 11/28/2016  . Embolic stroke ([place]) 26/33/3545  . Cerebral thrombosis with cerebral infarction 11/22/2016  . Intramural aortic hematoma ([place]) 11/21/2016   Past Medical History:  Past Medical History:  Diagnosis Date  . Hypertension    Past Surgical History:  Past Surgical History:  Procedure Laterality Date  . INGUINAL HERNIA REPAIR    . UMBILICAL HERNIA REPAIR      Assessment / Plan / Recommendation Clinical Impression Jordan Mcintosh an 61 y.o.malewith h/o of HTN, polysubstance abuse, medical noncompliance (no meds X 9 months) who was admitted on 11/21/16 with complaints of abdominal pain and back pain. 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 He was started on cardene drip and morphine with resolution of symptoms. On 4/23 am, he developed acute onset of right facial weakness, slurred speech and RUE weakness. CT head negative . 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 CTA head/neck revealed advanced atheromatous narrowing of L-VA at origin, advanced right MCA  bifurcation stenosis and no evidence of progression of intramural hematoma. 2 D echo with EF 55-60% and no wall abnormality.    Dr. Erlinda Hong felt that stroke embolic due to aortic origin or thrombotic due to SVD and recommended ASA for secondary stroke prevention. Blood pressures better controlled and he has been slowly weaned off  IV labetalol with titration of BP medications.  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  Abdomen. And plans to repeat CTA in a week?  Patient indicated difficulty swallowing and esophagram done showing mild esophageal dysmotility with delayed passage of barium tablet. Patient with resultant RUE> RLE weakness with unsteady gait, tendency to lean to the right, impulsivity and lacks awareness of deficits.   CIR recommended for follow up therapy.  Pt admited to CIR on 11/27/16 with speech-language evaluation completed on 11/29/16. Pt demonstrates functional cognitive ability as evidenced by obtaining score of 26 out of 30 on MOCA version 7.1 (n=>26). Pt and family members present report recent decline in STM that was impacting pt's ability to work and his conversational ability prior to CVA. Pt also demonstrates very mild flaccid dysarthria d/t right facial weakness. This impacts his imtelligibility at the simple conversation level (~80% intelligible). Pt would benefit from skilled St to introduce compensatory strategies to compensate for STM loss and to increase speech intelligibility in order to increase functional independence and reduce caregiver burden prior to discharge. I do not anticipate that pt will need follow up ST services at discharge.    Skilled Therapeutic Interventions  Skilled treatment session focused on completion of speech-language evaluation, see above. POC, goals discussed as well as discharge plan. Pt and family in agreement with introduction of  compensatory memory strategies and speech intelligibility strategies.    SLP Assessment  Patient will need skilled [place] [facility] Services during CIR admission    Recommendations  Recommendations for Other Services: Neuropsych consult Patient destination: Home Follow up Recommendations: None Equipment Recommended: None recommended by SLP    SLP Frequency 1 to 3 out of 7 days   SLP Duration  SLP Intensity  SLP Treatment/Interventions 7 to 10 days  Minumum of 1-2 x/day, 30 to 90 minutes  Cueing hierarchy;Functional tasks;Environmental controls;Internal/external aids;Patient/family education    Pain Pain Assessment Pain Assessment: 0-10 Pain Score: 8  Pain Location: Shoulder Pain Orientation: Right Pain Descriptors / Indicators: Aching Patients Stated Pain Goal: 2 Pain Intervention(s): Medication (See eMAR)  Prior Functioning Cognitive/Linguistic Baseline: Baseline deficits Baseline deficit details: Pt and wife report recent memory deficits - becoming problematic in daily living Type of Home: Apartment  Lives With: Alone Available Help at Discharge: Family (Wife (been separated for 8 years) plans to discharge to her place) Education: < highschool Vocation: Full time employment  Function:   Cognition Comprehension Comprehension assist level: Follows complex conversation/direction with extra time/assistive device  Expression   Expression assist level: Expresses complex 90% of the time/cues < 10% of the time  Social Interaction Social Interaction assist level: Interacts appropriately with others with medication or extra time (anti-anxiety, antidepressant).  Problem Solving Problem solving assist level: Solves complex 90% of the time/cues < 10% of the time  Memory Memory assist level: Recognizes or recalls 90% of the time/requires cueing < 10% of the time   Short Term Goals: Week 1: SLP Short Term Goal 1 (Week 1): Pt will recall speech intelligibility  startegies with Mod I.  SLP Short Term Goal 2 (Week 1): Given Mod I, pt will use speech intelligibility strategies to achieve >90% speech intelligibility at the simple conversation level.  SLP Short Term Goal 3 (Week 1): Pt will recall and implement compensatory memory strategies with Mod I.   Refer to Care Plan for Long Term Goals  Recommendations for other services: Neuropsych  Discharge Criteria: Patient will be discharged from SLP if patient refuses treatment 3 consecutive times without medical reason, if treatment goals not met, if there is a change in medical status, if patient makes no progress towards goals or if patient is discharged from hospital.  The above assessment, treatment plan, treatment alternatives and goals were discussed and mutually agreed upon: by patient and by family   Theia Dezeeuw B. Rutherford Nail, M.S., [place] 11/29/2016, 4:36 PM

## 2016-11-29 NOTE — Progress Notes (Signed)
Patient information reviewed and entered into eRehab system by Saffron Busey, RN, CRRN, PPS Coordinator.  Information including medical coding and functional independence measure will be reviewed and updated through discharge.    

## 2016-11-29 NOTE — Progress Notes (Signed)
Occupational Therapy Session Note  Patient Details  Name: Jordan Mcintosh MRN: [id number] Date of Birth: 1955/08/16  Today's Date: 11/29/2016 OT Individual Time: 9147-8295 and 6213-0865 OT Individual Time Calculation (min): 56 min and 30 min   Short Term Goals: Week 1:  OT Short Term Goal 1 (Week 1): STG=LTG d/t ELOS  Skilled Therapeutic Interventions/Progress Updates:    1) Treatment session with focus on functional use of RUE during self-feeding and self-care tasks.  Pt received in bed eating breakfast with Lt hand.  Applied built up handle to spoon and educated on forced use of RUE during self-feeding to continue to engage brain and RUE in motor recovery.  Pt required increased time with self-feeding secondary to decreased strength and motor control.  Donned socks seated EOB with increased time, crossing leg over opposite knee.  Engaged in oral care in standing at sink with intermittent steady assist for standing balance when reaching for items.  Utilized RUE 50% of time with oral care, utilizing Lt hand when removing dentures for increased safety.  Utilized medium sized pegs with coban wrapping with focus on fine motor control, grasp, and coordination of wrist and intrinsic hand movements to place pegs in peg holes.  Left upright in recliner with RN present to provide morning meds.  2) Treatment session with focus on dressing and RUE NMR.  Pt received seated EOB with family present, reporting they had brought him clothes.  Completed dressing with min guard for standing when pulling pants over hips.  Provided pt with theraputty and handout.  Educated pt on exercises on handout with pt able to return demonstration with min cues for proper technique.  Encouraged pt to continue to engage in strengthening exercises and functional use of RUE for continued motor recovery.  Therapy Documentation Precautions:  Precautions Precautions: Fall Restrictions Weight Bearing Restrictions: No General:    Vital Signs: Therapy Vitals Pulse Rate: 81 BP: 137/66 Pain:  Pt with no c/o pain  See Function Navigator for Current Functional Status.   Therapy/Group: Individual Therapy  Rosalio Loud 11/29/2016, 9:32 AM

## 2016-11-29 NOTE — Plan of Care (Signed)
Problem: Food- and Nutrition-Related Knowledge Deficit (NB-1.1) Goal: Nutrition education Formal process to instruct or train a patient/client in a skill or to impart knowledge to help patients/clients voluntarily manage or modify food choices and eating behavior to maintain or improve health. Outcome: Completed/Met Date Met: 11/29/16 Nutrition Education Note  RD consulted for nutrition diet education.  Lipid Panel     Component Value Date/Time   CHOL 164 11/22/2016 0037   TRIG 117 11/22/2016 0037   HDL 34 (L) 11/22/2016 0037   CHOLHDL 4.8 11/22/2016 0037   VLDL 23 11/22/2016 0037   LDLCALC 107 (H) 11/22/2016 0037    RD provided "Heart Healthy Nutrition Therapy" as well as "Counting Carbohydrates for People with Diabetes handout from the Academy of Nutrition and Dietetics. Noted pt prediabetic. Reviewed patient's dietary recall. Provided examples on ways to decrease sodium and fat intake in diet. Discouraged intake of processed foods and use of salt shaker. Encouraged fresh fruits and vegetables as well as whole grain sources of carbohydrates to maximize fiber intake. Discussed diabetic friendly drink options.Teach back method used.  Expect good compliance.  Body mass index is 28.76 kg/m. Pt meets criteria for overweight based on current BMI.  Current diet order is heart healthy/carbohydrate modified, patient is consuming approximately 100% of meals at this time. Labs and medications reviewed. Pt currently has Prostat ordered. No further nutrition interventions warranted at this time. RD contact information provided. If additional nutrition issues arise, please re-consult RD.  Corrin Parker, MS, RD, LDN Pager # [phone] After hours/ weekend pager # [phone]

## 2016-11-29 NOTE — IPOC Note (Signed)
Overall Plan of Care [facility]) Patient Details Name: Jordan Mcintosh MRN: [id number] DOB: [birth year]  Admitting Diagnosis: admit  Hospital Problems: Principal Problem:   Left sided lacunar infarction [facility]) Active Problems:   Embolic stroke ([facility])   Prediabetes   Benign essential HTN   AKI (acute kidney injury) ([facility])   History of noncompliance with medical treatment   Hypoalbuminemia due to protein-calorie malnutrition ([facility])   Hematoma     Functional Problem List: Nursing Endurance, Safety, Medication Management, Motor, Skin Integrity  PT Balance, Endurance, Motor, Safety, Pain, Behavior  OT Balance, Cognition, Edema, Endurance, Motor, Safety  SLP    TR         Basic ADL's: OT Eating, Grooming, Bathing, Dressing, Toileting     Advanced  ADL's: OT Simple Meal Preparation     Transfers: PT Bed Mobility, Bed to Chair, Car  OT Toilet, Tub/Shower     Locomotion: PT Ambulation, Stairs     Additional Impairments: OT Fuctional Use of Upper Extremity  SLP        TR      Anticipated Outcomes Item Anticipated Outcome  Self Feeding MOD I  Swallowing      Basic self-care  MOD I  Toileting  MOD I   Bathroom Transfers MOD I  Bowel/Bladder  Patient will remain continent of bowel and bladder during hospital stay  Transfers  mod I  Locomotion  supervision for stairs without rails, otherwise mod I  Communication     Cognition     Pain  Pt's pain will remain 3 or less during hospital stay  Safety/Judgment  Pt will be free from falls and injuries during hospital stay   Therapy Plan: PT Intensity: Minimum of 1-2 x/day ,45 to 90 minutes PT Frequency: 5 out of 7 days PT Duration Estimated Length of Stay: 7-10 days OT Intensity: Minimum of 1-2 x/day, 45 to 90 minutes OT Frequency: 5 out of 7 days OT Duration/Estimated Length of Stay: 7-10 days         Team Interventions: Nursing Interventions Patient/Family Education, Disease Management/Prevention, Medication  Management, Skin Care/Wound Management, Discharge Planning, Psychosocial Support  PT interventions Ambulation/gait training, Community reintegration, DME/adaptive equipment instruction, Neuromuscular re-education, Psychosocial support, Stair training, UE/LE Strength taining/ROM, UE/LE Coordination activities, Therapeutic Activities, Skin care/wound management, Pain management, Functional electrical stimulation, Discharge planning, Balance/vestibular training, Cognitive remediation/compensation, Disease management/prevention, Functional mobility training, Patient/family education, Splinting/orthotics, Therapeutic Exercise, Visual/perceptual remediation/compensation  OT Interventions Balance/vestibular training, Cognitive remediation/compensation, Discharge planning, Community reintegration, DME/adaptive equipment instruction, Functional electrical stimulation, Functional mobility training, Neuromuscular re-education, Patient/family education, Self Care/advanced ADL retraining, Therapeutic Activities, Therapeutic Exercise, UE/LE Coordination activities, Visual/perceptual remediation/compensation  SLP Interventions    TR Interventions    SW/CM Interventions Discharge Planning, Psychosocial Support, Patient/Family Education    Team Discharge Planning: Destination: PT-Home ,OT- Home , SLP-  Projected Follow-up: PT-Outpatient PT, OT-  Home health OT, SLP-  Projected Equipment Needs: PT- (none anticipated at this time), OT- To be determined, SLP-  Equipment Details: PT- , OT-  Patient/family involved in discharge planning: PT- Patient,  OT-Patient, SLP-   MD ELOS: 7-10 days. Medical Rehab Prognosis:  Good Assessment: 61 y.o.malewith h/o of HTN, polysubstance abuse, medical noncompliance (no meds X 9 months) who was admitted on 11/21/16 with complaints of abdominal pain and back pain. 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 He was started on cardene drip and morphine with resolution of symptoms. On 4/23  am, he developed acute onset of right facial weakness, slurred speech and RUE weakness. CT head negative . 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  2 D echo with EF 55-60% and no wall abnormality. Neurology felt that stroke embolic due to aortic origin or thrombotic due to SVD and recommended ASA for secondary stroke prevention. Blood pressures better controlled and he has been slowly weaned off  IV labetalol with titration of BP medications.  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  Abdomen. And plans to repeat CTA in a week?  Patient indicated difficulty swallowing and esophagram done showing mild esophageal dysmotility with delayed passage of barium tablet. Patient with resultant RUE> RLE weakness with unsteady gait, tendency to lean to the right, impulsivity and lacks awareness of deficits. Will set goals for Mod I for most tasks with PT/OT.   See Team Conference Notes for weekly updates to the plan of care

## 2016-11-29 NOTE — Progress Notes (Signed)
Physical Therapy Note  Patient Details  Name: Jordan Mcintosh MRN: [id number] Date of Birth: 01-20-1956 Today's Date: 11/29/2016  1105-1205 60 min individual tx Pain:n none  Supine > sit in flat bed, no rails with supervision. gati in room without AD to toilet and sink for continenet voiding. Gait to/from gym without AD witout LOB, transporting tissue box with R hand without spillage..  neuromuscular re-education via forced use for balance strategies and R hip protraction via tactile cues, in standing on wedge, x 2 minutes.   With external perturbations in standing, pt demonstrated inadequate R ankle and absent hip and stepping strategy; improved with tactile cues and practice.  Quadruped> tall kneeling for 10 x 1 terminal hip extension with R shoulder extension for full R trunk extension, quadruped > 3 point> 2 point with  LLE and RUE lifted, x 15 seconds, cues for full R elbow extension.   Pt able to move into 3 point with LLE elevated, but unable to lift LUE.  10 x 1 push up from quadruped position, without LOB.  Standing with LUE support manipulating beach ball under R foot for fine motor control and R pelvic activation.  Gait returning to room, without AD, kicking Yoga block with R/L foot, min guard assist with 1 LOB.   Pt left resting in recliner with  all needs within reach. PT set pt up for lunch.  See function navigator for current status.   Yaniel Limbaugh 11/29/2016, 7:59 AM

## 2016-11-30 ENCOUNTER — Inpatient Hospital Stay (HOSPITAL_COMMUNITY): Payer: BLUE CROSS/BLUE SHIELD | Admitting: Speech Pathology

## 2016-11-30 ENCOUNTER — Inpatient Hospital Stay (HOSPITAL_COMMUNITY): Payer: BLUE CROSS/BLUE SHIELD | Admitting: Physical Therapy

## 2016-11-30 ENCOUNTER — Inpatient Hospital Stay (HOSPITAL_COMMUNITY): Payer: BLUE CROSS/BLUE SHIELD | Admitting: Occupational Therapy

## 2016-11-30 DIAGNOSIS — I6340 Cerebral infarction due to embolism of unspecified cerebral artery: Secondary | ICD-10-CM

## 2016-11-30 DIAGNOSIS — I7101 Dissection of thoracic aorta: Secondary | ICD-10-CM

## 2016-11-30 NOTE — Progress Notes (Signed)
Jordan Mcintosh PHYSICAL MEDICINE & REHABILITATION     PROGRESS NOTE  Subjective/Complaints:  Pt seen working with therapy this AM. He slept well overnight and denies complaints this AM.    ROS: Denies CP, SOB, N/V/D.  Objective: Vital Signs: Blood pressure 127/70, pulse 97, temperature 97.8 F (36.6 C), temperature source Oral, resp. rate 16, height  (1.803 m), weight 96.4 kg (212 lb 8.4 oz), SpO2 97 %. No results found.  Recent Labs  11/28/16 0537  WBC 8.6  HGB 14.0  HCT 41.8  PLT 190    Recent Labs  11/28/16 0537  NA 136  K 4.2  CL 103  GLUCOSE 105*  BUN 28*  CREATININE 1.39*  CALCIUM 9.1   CBG (last 3)  No results for input(s): GLUCAP in the last 72 hours.  Wt Readings from Last 3 Encounters:  11/30/16 96.4 kg (212 lb 8.4 oz)  11/25/16 96.6 kg (213 lb)    Physical Exam:  BP 127/70 (BP Location: Right Arm)   Pulse 97   Temp 97.8 F (36.6 C) (Oral)   Resp 16   Ht  (1.803 m)   Wt 96.4 kg (212 lb 8.4 oz)   SpO2 97%   BMI 29.64 kg/m  Constitutional: He appears well-developed and well-nourished.  HENT: Normocephalic and atraumatic.  Eyes: EOMI. No discharge.  Cardiovascular: RRR.  No JVD. Respiratory: Effort normal and breath sounds normal.  GI: Soft. Bowel sounds are normal.  Musculoskeletal: He exhibits no edema or tenderness.  Neurological: He is alert and oriented x3.  Right facial weakness with mild dysarthria Able to follow commands.  Motor: RUE 4/5 proximal to distal RLE: 4/5 proximal to distal LUE/LLE: 5/5 proximal to distal  Skin: Skin is warm and dry.  Psychiatric: He has a normal mood and affect.   Assessment/Plan: 1. Functional deficits secondary to Left posterior corona radiata, lentiform and ext capsule infarcts which require 3+ hours per day of interdisciplinary therapy in a comprehensive inpatient rehab setting. Physiatrist is providing close team supervision and 24 hour management of active medical problems listed  below. Physiatrist and rehab team continue to assess barriers to discharge/monitor patient progress toward functional and medical goals.  Function:  Bathing Bathing position   Position: Shower  Bathing parts Body parts bathed by patient: Right arm, Left lower leg, Left arm, Chest, Abdomen, Buttocks, Front perineal area, Right upper leg, Left upper leg, Right lower leg Body parts bathed by helper: Back  Bathing assist Assist Level: Touching or steadying assistance(Pt > 75%)      Upper Body Dressing/Undressing Upper body dressing   What is the patient wearing?: Pull over shirt/dress     Pull over shirt/dress - Perfomed by patient: Thread/unthread right sleeve, Thread/unthread left sleeve, Put head through opening, Pull shirt over trunk          Upper body assist Assist Level: Touching or steadying assistance(Pt > 75%) (standing)      Lower Body Dressing/Undressing Lower body dressing   What is the patient wearing?: Pants, Socks, Shoes     Pants- Performed by patient: Thread/unthread right pants leg, Thread/unthread left pants leg, Pull pants up/down Pants- Performed by helper: Pull pants up/down (pants tight) Non-skid slipper socks- Performed by patient: Don/doff right sock, Don/doff left sock   Socks - Performed by patient: Don/doff right sock, Don/doff left sock   Shoes - Performed by patient: Don/doff right shoe, Don/doff left shoe, Fasten right, Fasten left  Lower body assist Assist for lower body dressing: Touching or steadying assistance (Pt > 75%)      Toileting Toileting   Toileting steps completed by patient: Performs perineal hygiene, Adjust clothing prior to toileting, Adjust clothing after toileting   Toileting Assistive Devices: Grab bar or rail  Toileting assist Assist level: Touching or steadying assistance (Pt.75%)   Transfers Chair/bed transfer   Chair/bed transfer method: Ambulatory Chair/bed transfer assist level: Supervision or  verbal cues       Locomotion Ambulation     Max distance: 150 Assist level: Supervision or verbal cues   Wheelchair          Cognition Comprehension Comprehension assist level: Follows complex conversation/direction with extra time/assistive device  Expression Expression assist level: Expresses complex 90% of the time/cues < 10% of the time  Social Interaction Social Interaction assist level: Interacts appropriately with others with medication or extra time (anti-anxiety, antidepressant).  Problem Solving Problem solving assist level: Solves complex 90% of the time/cues < 10% of the time  Memory Memory assist level: Recognizes or recalls 90% of the time/requires cueing < 10% of the time    Medical Problem List and Plan: 1.  Right hemiparesis and gait disorder  secondary to Left posterior corona radiata, lentiform and ext capsule infarcts  Cont CIR 2.  DVT Prophylaxis/Anticoagulation: Mechanical: Sequential compression devices, below knee Bilateral lower extremities 3. Pain Management: N/A 4. Mood: LCSW to follow for evaluation and support.  5. Neuropsych: This patient is capable of making decisions on his own behalf. 6. Skin/Wound Care: routine pressure relief measures. He is able to move reposition without difficulty.  7. Fluids/Electrolytes/Nutrition: Monitor I/Os.  8. Hematoma of descending periaorta and penetrating ulcer: BP goals < 140.  ?Repeat CT next week? 9. Uncontrolled HTN: Monitor BP qid. Continue norvasc, lasix, cozaar and metoprolol--catapres added 4/27   Controlled 5/1 10. Dyslipidemia: On Lipitor.  11. Prediabetes: Hgb A1C- 5.9. Educate patient on appropriate diet.   Controlled 5/1 12. Hypoalbuminemia  Supplement initiated 4/30 13. AKI  Cr 1.39 on 4/29  Encourage fluids  Cont to monitor 14. History of noncompliance  Educate  LOS (Days) 3 A FACE TO FACE EVALUATION WAS PERFORMED  Aldo Sondgeroth Karis Juba 11/30/2016 8:16 AM

## 2016-11-30 NOTE — Progress Notes (Signed)
  Subjective:  Blood pressure well controlled, now normal No epigastric or back pain Progressing well with PT, OT and speech therapy Appreciate excellent care of rehabilitation multidisciplinary service Will check creatinine in a.m. 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  Objective: Vital signs in last 24 hours: Temp:  [97.8 F (36.6 C)-98.1 F (36.7 C)] 98.1 F (36.7 C) (05/01 1626) Pulse Rate:  [64-97] 64 (05/01 1626) Resp:  [16-18] 18 (05/01 1626) BP: (126-136)/(65-75) 126/65 (05/01 1626) SpO2:  [97 %-99 %] 99 % (05/01 1626) Weight:  [212 lb 8.4 oz (96.4 kg)] 212 lb 8.4 oz (96.4 kg) (05/01 0500)  Hemodynamic parameters for last 24 hours:  stable  Intake/Output from previous day: 04/30 0701 - 05/01 0700 In: 840 [P.O.:840] Out: -  Intake/Output this shift: Total I/O In: 240 [P.O.:240] Out: -        Exam    General- alert and comfortable   Lungs- clear without rales, wheezes   Cor- regular rate and rhythm, no murmur , gallop   Abdomen- soft, non-tender   Extremities - warm, non-tender, minimal edema   Neuro- oriented, appropriate, stable right side weakness   Lab Results:  Recent Labs  11/28/16 0537  WBC 8.6  HGB 14.0  HCT 41.8  PLT 190   BMET:  Recent Labs  11/28/16 0537  NA 136  K 4.2  CL 103  CO2 24  GLUCOSE 105*  BUN 28*  CREATININE 1.39*  CALCIUM 9.1    PT/INR: No results for input(s): LABPROT, INR in the last 72 hours. ABG No results found for: PHART, HCO3, TCO2, ACIDBASEDEF, O2SAT CBG (last 3)  No results for input(s): GLUCAP in the last 72 hours.  Assessment/Plan: S/P  Recheck creatinine Wednesday a.m. If normal will repeat one more CTA of the thoracic aorta prior to discharge   LOS: 3 days    Jordan Mcintosh 11/30/2016

## 2016-11-30 NOTE — Progress Notes (Signed)
Speech Language Pathology Daily Session Note  Patient Details  Name: Jordan Mcintosh MRN: [id number] Date of Birth: Jan 17, 1956  Today's Date: 11/30/2016 SLP Individual Time: 0950-1030; 4098-1191 SLP Individual Time Calculation (min): 40 min; 30 min   Short Term Goals: Week 1: SLP Short Term Goal 1 (Week 1): Pt will recall speech intelligibility startegies with Mod I.  SLP Short Term Goal 2 (Week 1): Given Mod I, pt will use speech intelligibility strategies to achieve >90% speech intelligibility at the simple conversation level.  SLP Short Term Goal 3 (Week 1): Pt will recall and implement compensatory memory strategies with Mod I.   Skilled Therapeutic Interventions:  Session 1:  Pt was seen for skilled ST targeting cognitive goals.  SLP reviewed memory strategies with pt and provided him with a handout to maximize carryover in between therapy sessions.  Therapist also facilitated the session with a novel card game targeting use of associations as a memory compensatory strategy.  Pt was able to generate and recall 3 out of 4 word-picture associations independently, improving to 4 out of 4 accuracy with supervision verbal cues.   Pt was also able to name 5 out of 5 category members from task with mod I.  Pt required x1 supervision verbal cue for route recall when ambulating back to room.  Pt left resting in recliner with call bell within reach.  Continue per current plan of care.   Session 2:  Pt was seen for skilled ST targeting speech goals.  SLP provided skilled education regarding speech intelligibility strategies.  Handout was provided to maximize carryover in between sessions.  SLP facilitated the session with a verbal barrier task to address use of intelligibility strategies.  Pt utilized increased vocal intensity and overarticulation to achieve intelligibility in conversations with supervision-mod I.  Pt was left in recliner with call bell within reach.  Continue per current plan of care.        Function:  Eating Eating                 Cognition Comprehension Comprehension assist level: Follows basic conversation/direction with no assist  Expression   Expression assist level: Expresses complex 90% of the time/cues < 10% of the time  Social Interaction Social Interaction assist level: Interacts appropriately with others with medication or extra time (anti-anxiety, antidepressant).  Problem Solving Problem solving assist level: Solves basic problems with no assist  Memory Memory assist level: Recognizes or recalls 90% of the time/requires cueing < 10% of the time    Pain Pain Assessment Pain Assessment: No/denies pain  Therapy/Group: Individual Therapy  Teancum Brule, Melanee Spry 11/30/2016, 3:01 PM

## 2016-11-30 NOTE — Progress Notes (Signed)
Occupational Therapy Session Note  Patient Details  Name: Jordan Mcintosh MRN: [id number] Date of Birth: [birth year]  Today's Date: 11/30/2016 OT Individual Time: 4098-1191 OT Individual Time Calculation (min): 60 min    Short Term Goals: Week 1:  OT Short Term Goal 1 (Week 1): STG=LTG d/t ELOS  Skilled Therapeutic Interventions/Progress Updates:    Treatment session with focus on functional mobility, standing balance, and sequencing with familiar self-care task.  Pt completed bathing at sit > stand level in room shower with intermittent supervision.  Pt with good endurance and standing balance during bathing and dressing.  Noted increased grasp in Rt hand while maintaining grasp on small soap bottle and when brushing teeth.  Pt required increased time throughout for thoroughness and with increased use of RUE.    Therapy Documentation Precautions:  Precautions Precautions: Fall Restrictions Weight Bearing Restrictions: No Pain: Pain Assessment Pain Assessment: 0-10 Pain Score: 4  Pain Intervention(s): Medication (See eMAR)  See Function Navigator for Current Functional Status.   Therapy/Group: Individual Therapy  Rosalio Loud 11/30/2016, 9:41 AM

## 2016-11-30 NOTE — Progress Notes (Signed)
Physical Therapy Session Note  Patient Details  Name: Jordan Mcintosh MRN: [id number] Date of Birth: [birth year]  Today's Date: 11/30/2016 PT Individual Time: 0900-0930 PT Individual Time Calculation (min): 30 min   Short Term Goals: Week 1:  PT Short Term Goal 1 (Week 1): STG = LTG due to short ELOS.  Skilled Therapeutic Interventions/Progress Updates: Pt received seated in recliner, denies pain and agreeable to treatment. Gait to gym with no AD and close S; of note pt has mild/moderate LOBs when turning head to look at and talk to therapist with staggering and requiring several steps to catch his balance. Dynamic balance on biodex on stable and unstable surface for focus on ankle/hip strategy and weight shifting; progress BUE>no UE support both on stable and unstable surface with min guard when performing unstable platform with no UEs. Dynamic gait in hallway to locate numbered targets in order, including targets on floor, for focus on visual scanning, divided attention, head turns with gait, direction changes. Gait in hallway walking backwards, side stepping, and grapevine/weaving R/L. Remained seated in recliner at end of session, all needs in reach.      Therapy Documentation Precautions:  Precautions Precautions: Fall Restrictions Weight Bearing Restrictions: No Pain: Pain Assessment Pain Assessment: No/denies pain Pain Score: 0-No pain   See Function Navigator for Current Functional Status.   Therapy/Group: Individual Therapy  Vista Lawman 11/30/2016, 10:50 AM

## 2016-11-30 NOTE — Evaluation (Signed)
Recreational Therapy Assessment and Plan  Patient Details  Name: Jordan Mcintosh MRN: [id number] Date of Birth: [birth year] Today's Date: 11/30/2016  Rehab Potential: Good ELOS: 10 days   Assessment Problem List:      Patient Active Problem List   Diagnosis Date Noted  . Left sided lacunar infarction ([place]) 11/28/2016  . Embolic stroke ([place]) 33/43/5686  . Cerebral thrombosis with cerebral infarction 11/22/2016  . 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 Hypertension    Past Surgical History:       Past Surgical History:  Procedure Laterality Date  . INGUINAL HERNIA REPAIR    . UMBILICAL HERNIA REPAIR      Assessment & Plan Clinical Impression: Patient is a 61 y.o. year old malewith h/o of HTN, polysubstance abuse, medical noncompliance (no meds X 9 months) who was admitted on 11/21/16 with complaints of abdominal pain and back pain. 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 He was started on cardene drip and morphine with resolution of symptoms. On 4/23 am, he developed acute onset of right facial weakness, slurred speech and RUE weakness. CT head negative . 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 2 D echo with EF 55-60% and no wall abnormality.  Dr. Erlinda Hong felt that stroke embolic due to aortic origin or thrombotic due to SVD and recommended ASA for secondary stroke prevention. Blood pressures better controlled and he has been slowly weaned off IV  labetalol with titration of BP medications. 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 Abdomen. And plans to repeat CTA in a week? Patient indicated difficulty swallowing and esophagram done showing mild esophageal dysmotility with delayed passage of barium tablet. Patient with resultant RUE>RLE weakness with unsteady gait, tendency to lean to the right, impulsivity and lacks awareness of deficits. CIR recommended for follow up therapy. Patient transferred to CIR on 11/27/2016.   Pt presents with decreased activity tolerance, decreased functional mobility, decreased balance, decreased leisure awareness Limiting pt's independence with leisure/community pursuits.   Leisure History/Participation Premorbid leisure interest/current participation: Medical laboratory scientific officer - Psychologist, forensic Other Leisure Interests: Television;Reading Leisure Participation Style: Alone;With Family/Friends Awareness of Community Resources: Good-identify 3 post discharge leisure resources Psychosocial / Spiritual Social interaction - Mood/Behavior: Cooperative Academic librarian Appropriate for Education?: Yes Recreational Therapy Orientation Orientation -Reviewed with patient: Available activity resources Strengths/Weaknesses Patient Strengths/Abilities: Willingness to participate Patient weaknesses: Physical limitations;Minimal Premorbid Leisure Activity TR Patient demonstrates impairments in the following area(s): Endurance;Motor;Safety  Plan Rec Therapy Plan Is patient appropriate for Therapeutic Recreation?: Yes Rehab Potential: Good Treatment times per week: Min 1 TR group/session >30 minutes during LOS Estimated Length of Stay: 10 days TR Treatment/Interventions: 1:1 session;Adaptive equipment instruction;Balance/vestibular training;Functional mobility training;Community  reintegration;Group participation (Comment);Leisure education;Patient/family education;Therapeutic activities;Recreation/leisure participation;Therapeutic exercise  Recommendations for other services: None   Discharge Criteria: Patient will be discharged from TR if patient refuses treatment 3 consecutive times without medical reason.  If treatment goals not met, if  there is a change in medical status, if patient makes no progress towards goals or if patient is discharged from hospital.  The above assessment, treatment plan, treatment alternatives and goals were discussed and mutually agreed upon: by patient  [place] 11/30/2016, 3:10 PM

## 2016-11-30 NOTE — Progress Notes (Signed)
Physical Therapy Note  Patient Details  Name: Jordan Mcintosh MRN: [id number] Date of Birth: [birth year] Today's Date: 11/30/2016    Time: 1300-1355 55 minutes  1:1 No c/o pain.  Pt performed gait with supervision >1000' to [place] tower entrance, on uneven surfaces and thresholds and in distracting environments all with supervision. Pt with more noticeable foot drag on Rt when fatigued, PT educated pt that when he can hear his foot sliding across the ground that means it is time to rest, pt states understanding.  Standing clothespin task for Rt UE strength and coordination with increased time.  Squat and reach peg activity for LE strength, balance and Rt UE coordination, supervision for balance.  Kinetron 2 x 3 minutes for LE strengthening.  Pt left in room with needs at hand.   Isabelly Kobler 11/30/2016, 1:56 PM

## 2016-12-01 ENCOUNTER — Inpatient Hospital Stay (HOSPITAL_COMMUNITY): Payer: BLUE CROSS/BLUE SHIELD | Admitting: Occupational Therapy

## 2016-12-01 ENCOUNTER — Inpatient Hospital Stay (HOSPITAL_COMMUNITY): Payer: BLUE CROSS/BLUE SHIELD

## 2016-12-01 ENCOUNTER — Encounter (HOSPITAL_COMMUNITY): Payer: BLUE CROSS/BLUE SHIELD | Admitting: Psychology

## 2016-12-01 ENCOUNTER — Inpatient Hospital Stay (HOSPITAL_COMMUNITY): Payer: BLUE CROSS/BLUE SHIELD | Admitting: Speech Pathology

## 2016-12-01 LAB — BASIC METABOLIC PANEL
ANION GAP: 8 (ref 5–15)
BUN: 27 mg/dL — ABNORMAL HIGH (ref 6–20)
CO2: 26 mmol/L (ref 22–32)
Calcium: 9.1 mg/dL (ref 8.9–10.3)
Chloride: 102 mmol/L (ref 101–111)
Creatinine, Ser: 1.33 mg/dL — ABNORMAL HIGH (ref 0.61–1.24)
GFR calc Af Amer: >60 mL/min (ref >60)
GFR, EST NON AFRICAN AMERICAN: 57 mL/min — AB (ref >60)
GLUCOSE: 91 mg/dL (ref 65–99)
POTASSIUM: 4.3 mmol/L (ref 3.5–5.1)
Sodium: 136 mmol/L (ref 135–145)

## 2016-12-01 MED ORDER — SODIUM CHLORIDE 0.9 % IV SOLN
INTRAVENOUS | Status: AC
  Administered 2016-12-01: 19:00:00 via INTRAVENOUS

## 2016-12-01 MED ORDER — SODIUM CHLORIDE 0.9 % IV SOLN
30.0000 meq | Freq: Once | INTRAVENOUS | Status: DC
  Filled 2016-12-01: qty 15

## 2016-12-01 NOTE — Progress Notes (Signed)
Occupational Therapy Session Note  Patient Details  Name: Jordan Mcintosh MRN: [id number] Date of Birth: 01/22/1956  Today's Date: 12/01/2016 OT Individual Time: 0700-0800 OT Individual Time Calculation (min): 60 min    Short Term Goals: Week 1:  OT Short Term Goal 1 (Week 1): STG=LTG d/t ELOS  Skilled Therapeutic Interventions/Progress Updates:    Treatment session with focus on RUE NMR with self-feeding and table top activities. Pt received upright in bed. Declined bathing and dressing this session.  Donned hospital socks seated at EOB prior to transferring to recliner with supervision.  Engaged in self-feeding with RUE without use of built up handle for increased focus on coordination and motor control.  Noted improved control without built up handle and wrist movement to engage in self-feeding.  Utilized resistive peg board with focus on replicating picture to challenge visual perception as well as RUE coordination and strength.  Min cues for problem solving when errors made copying pattern.  Addressed in-hand manipulation with pegs with pt demonstrating increased difficulty with thumb opposition. Pt reports noting improvements with RUE function overall and pleased with progress.    Therapy Documentation Precautions:  Precautions Precautions: Fall Restrictions Weight Bearing Restrictions: No General:   Vital Signs: Therapy Vitals Temp: 98.9 F (37.2 C) Temp Source: Oral Pulse Rate: 64 Resp: 16 BP: 100/66 Patient Position (if appropriate): Lying Oxygen Therapy SpO2: 100 % O2 Device: Not Delivered Pain:  Pt with no c/o pain  See Function Navigator for Current Functional Status.   Therapy/Group: Individual Therapy  Rosalio Loud 12/01/2016, 7:44 AM

## 2016-12-01 NOTE — Progress Notes (Signed)
Social Work Assessment and Plan  Patient Details  Name: Jordan Mcintosh MRN: [id number] Date of Birth: 1956-07-12  Today's Date: 11/30/2016  Problem List:  Patient Active Problem List   Diagnosis Date Noted  . Prediabetes   . Benign essential HTN   . AKI (acute kidney injury) ([place])   . History of noncompliance with medical treatment   . Hypoalbuminemia due to protein-calorie malnutrition ([place])   . Hematoma   . Left sided lacunar infarction ([place]) 11/28/2016  . Embolic stroke ([place]) 68/07/7516  . Cerebral thrombosis with cerebral infarction 11/22/2016  . 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 Hypertension    Past Surgical History:  Past Surgical History:  Procedure Laterality Date  . INGUINAL HERNIA REPAIR    . UMBILICAL HERNIA REPAIR     Social History:  reports that he has quit smoking. He has never used smokeless tobacco. He reports that he uses drugs, including Marijuana. He reports that he does not drink alcohol.  Family / Support Systems Marital Status: Separated How Long?: 8 years Patient Roles: Spouse, Other (Comment), Parent (brother, son, employee) Spouse/Significant Other: Allene Kusek - wife - [phone] Children: son and dtrs - not very involved with pt currently Other Supports: employer Anticipated Caregiver: wife Ability/Limitations of Caregiver: wife works second shift Caregiver Availability: Intermittent Family Dynamics: pt and wife are supportive of each other, even though they are separated  Social History Preferred language: English Religion:  Read: Yes Write: Yes Employment Status: Employed Name of Employer: Clinical research associate Return to Work Plans: 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  He wants to return when able.  Pt gave paperwork to care manager for MD to complete and has not received it back.  CSW will try to f/u on this. Legal  History/Current Legal Issues: none reported Guardian/Conservator: N/A - MD has determined that pt is capable of making his own decisions.  He has given CSW permission to talk with his wife.   Abuse/Neglect Physical Abuse: Denies Verbal Abuse: Denies Sexual Abuse: Denies Exploitation of patient/patient's resources: Denies Self-Neglect: Denies  Emotional Status Pt's affect, behavior and adjustment status: Pt is motivated to rehabilitate and reports feeling positive and ready to make changes, especially as he sees his progress. Recent Psychosocial Issues: Pt reports living "cleanly" in regards to substances, but has not been making other good lifestyle decisions.  He is motivated to change this. Psychiatric History: Pt reported depression in his past and that his faith helped him through it.  He remembers how he felt in th past and knows what to watch for.  He does not self report feeling anxious or depressed currently, but will reach out to CSW or wife if he needs to. Substance Abuse History: Pt admits to alcohol and marijuana use in the past, but is almost 9 years sober and plans to stay that way.  Patient / Family Perceptions, Expectations & Goals Pt/Family understanding of illness & functional limitations: Pt reports a good understanding of his limitations and condition. Premorbid pt/family roles/activities: Pt did not have many activities besides working.  Anticipated changes in roles/activities/participation: Pt plans to work to have more hobbies and activities after having the stroke. Pt/family expectations/goals: Pt wants to make better choices about what he eats and getting exercise.  He is grateful to have a second chance.  Community Resources [employer] Scripts: None Premorbid Home Care/DME Agencies: None Transportation  available at discharge: wife Resource referrals recommended: Neuropsychology, Support group (specify) (Stroke support group)  Discharge Planning Living  Arrangements: Alone Support Systems: Spouse/significant other, Children, Other relatives, Other (Comment) (employer and co-workers) Type of Residence: Private residence Administrator, sports: Multimedia programmer (specify) ([employer] [place]) Museum/gallery curator Resources: Employment Museum/gallery curator Screen Referred: No Living Expenses: Education officer, community Management: Patient Does the patient have any problems obtaining your medications?: No (Pt admits to not taking his BP medications for 6 months, but does not attribute the reason to not being able to obtain prescription or not having the funds to pay for medication.  Just a choice he made which he know understands the consequences.) Home Management: Pt was doing this.  He plans to go to his wife's home for a while until he can resume maintaining his apartment and being alone. Patient/Family Preliminary Plans: Pt plans to go to his wife's home for a while at d/c before returning to his own apartment. Barriers to Discharge: Steps Social Work Anticipated Follow Up Needs: HH/OP, Support Group Expected length of stay: 7 to 10 days  Clinical Impression CSW met with pt to introduce self and role of CSW, as well as to complete assessment.  Pt was open with CSW and talkative and reports feeling okay emotionally and is encouraged by his progress.  CSW explained it is normal to be more emotional after having a stroke and for him to reach out to CSW, family, or MD if he experiences this.  He is motivated to make changes so that he does not have another stroke.  Pt reports that although they do not live together, pt and wife are supportive of each other and pt plans to go to her home at d/c until pt feels comfortable going back to his apartment.  CSW referred pt for neuropsychologist visit.  Pt is concerned about where his FMLA paperwork is and CSW has tried to locate it on acute, but feel pt should obtain another copy and CSW will have Rehab PA/MD to complete and sign it.  CSW  will continue to follow and assist as needed.  Chauna Osoria, Silvestre Mesi 11/30/2016, 12:20 PM

## 2016-12-01 NOTE — Progress Notes (Signed)
Physical Therapy Note  Patient Details  Name: Jordan Mcintosh MRN: [id number] Date of Birth: July 07, 1956 Today's Date: 12/01/2016  1530-1600, 30 min individual tx Missed 15 min due to consult with [name], PA Pain: none per pt  Gait without AD on level tile throughout unit, supervision.  10 MWT= 12 seconds, = 3.28'/sec.  neuromuscular re-education via forced use, multimodal cues for alternating reciprocal movement bil LEs, with> without UE support , on Kinetron at 60 cm/sec x 40 cycles in sitting, x 40 slow cycles in standing, to elicit trunk righting reactions R trunk. R trunk elongation is minimal.  R lateral step ups with R UE extension against wall, x 5 reps to elicit R trunk lengthening, with good results.  Pt left resting in recliner with all needs within reach.   Allyanna Appleman 12/01/2016, 3:38 PM

## 2016-12-01 NOTE — Progress Notes (Signed)
[facility]ICINE [facility]ABILITATION     PROGRESS NOTE  Subjective/Complaints:  Pt seen sitting up in bed this AM.  He states he did not sleep as well overnight, but does not have a particular reason.    ROS: Denies CP, SOB, N/V/D.  Objective: Vital Signs: Blood pressure 100/66, pulse 64, temperature 98.9 F (37.2 C), temperature source Oral, resp. rate 16, height  (1.803 m), weight 96.8 kg (213 lb 6.5 oz), SpO2 100 %. No results found. No results for input(s): WBC, HGB, HCT, PLT in the last 72 hours.  Recent Labs  12/01/16 0659  NA 136  K 4.3  CL 102  GLUCOSE 91  BUN 27*  CREATININE 1.33*  CALCIUM 9.1   CBG (last 3)  No results for input(s): GLUCAP in the last 72 hours.  Wt Readings from Last 3 Encounters:  12/01/16 96.8 kg (213 lb 6.5 oz)  11/25/16 96.6 kg (213 lb)    Physical Exam:  BP 100/66 (BP Location: Right Arm)   Pulse 64   Temp 98.9 F (37.2 C) (Oral)   Resp 16   Ht  (1.803 m)   Wt 96.8 kg (213 lb 6.5 oz)   SpO2 100%   BMI 29.76 kg/m  Constitutional: He appears well-developed and well-nourished.  HENT: Normocephalic and atraumatic.  Eyes: EOMI. No discharge.  Cardiovascular: RRR.  No JVD. Respiratory: Effort normal and breath sounds normal.  GI: Soft. Bowel sounds are normal.  Musculoskeletal: He exhibits no edema or tenderness.  Neurological: He is alert and oriented x3.  Right facial weakness with dysarthria Able to follow commands.  Motor: RUE 4/5 proximal to distal (improving), Decrease in [facility] RLE: 4/5 proximal to distal (improving) LUE/LLE: 5/5 proximal to distal  Skin: Skin is warm and dry.  Psychiatric: He has a normal mood and affect.   Assessment/Plan: 1. Functional deficits secondary to Left posterior corona radiata, lentiform and ext capsule infarcts which require 3+ hours per day of interdisciplinary therapy in a comprehensive inpatient rehab setting. Physiatrist is providing close team supervision and 24 hour  management of active medical problems listed below. Physiatrist and rehab team continue to assess barriers to discharge/monitor patient progress toward functional and medical goals.  Function:  Bathing Bathing position   Position: Shower  Bathing parts Body parts bathed by patient: Right arm, Left lower leg, Left arm, Chest, Abdomen, Buttocks, Front perineal area, Right upper leg, Left upper leg, Right lower leg Body parts bathed by helper: Back  Bathing assist Assist Level: Supervision or verbal cues      Upper Body Dressing/Undressing Upper body dressing   What is the patient wearing?: Pull over shirt/dress     Pull over shirt/dress - Perfomed by patient: Thread/unthread right sleeve, Thread/unthread left sleeve, Put head through opening, Pull shirt over trunk          Upper body assist Assist Level: Supervision or verbal cues      Lower Body Dressing/Undressing Lower body dressing   What is the patient wearing?: Non-skid slipper socks Underwear - Performed by patient: Thread/unthread right underwear leg, Thread/unthread left underwear leg, Pull underwear up/down   Pants- Performed by patient: Thread/unthread right pants leg, Thread/unthread left pants leg, Pull pants up/down Pants- Performed by helper: Pull pants up/down (pants tight) Non-skid slipper socks- Performed by patient: Don/doff right sock, Don/doff left sock   Socks - Performed by patient: Don/doff right sock, Don/doff left sock   Shoes - Performed by patient: Don/doff right shoe,  Don/doff left shoe, Fasten right, Fasten left            Lower body assist Assist for lower body dressing: Set up   Set up : To obtain clothing/put away  Toileting Toileting   Toileting steps completed by patient: Performs perineal hygiene, Adjust clothing prior to toileting, Adjust clothing after toileting   Toileting Assistive Devices: Grab bar or rail  Toileting assist Assist level: Supervision or verbal cues    Transfers Chair/bed transfer   Chair/bed transfer method: Ambulatory Chair/bed transfer assist level: Supervision or verbal cues       Locomotion Ambulation     Max distance: 1000 Assist level: Supervision or verbal cues   Wheelchair          Cognition Comprehension Comprehension assist level: Follows basic conversation/direction with no assist  Expression Expression assist level: Expresses complex 90% of the time/cues < 10% of the time  Social Interaction Social Interaction assist level: Interacts appropriately with others with medication or extra time (anti-anxiety, antidepressant).  Problem Solving Problem solving assist level: Solves basic problems with no assist  Memory Memory assist level: Recognizes or recalls 90% of the time/requires cueing < 10% of the time    Medical Problem List and Plan: 1.  Right hemiparesis and gait disorder  secondary to Left posterior corona radiata, lentiform and ext capsule infarcts  Cont CIR 2.  DVT Prophylaxis/Anticoagulation: Mechanical: Sequential compression devices, below knee Bilateral lower extremities 3. Pain Management: N/A 4. Mood: LCSW to follow for evaluation and support.  5. Neuropsych: This patient is capable of making decisions on his own behalf. 6. Skin/Wound Care: routine pressure relief measures. He is able to move reposition without difficulty.  7. Fluids/Electrolytes/Nutrition: Monitor I/Os.  8. Hematoma of descending periaorta and penetrating ulcer: BP goals < 140.  ?Repeat CT next week 9. Uncontrolled HTN: Monitor BP qid. Continue norvasc, lasix, cozaar and metoprolol--catapres added 4/27   Controlled 5/2 10. Dyslipidemia: On Lipitor.  11. Prediabetes: Hgb A1C- 5.9. Educate patient on appropriate diet.   Controlled 5/2 12. Hypoalbuminemia  Supplement initiated 4/30 13. AKI  Cr 1.33 on 5/2  Encourage fluids  Cont to monitor  IVF tonight 14. History of noncompliance  Educate  LOS (Days) 4 A FACE TO FACE  EVALUATION WAS PERFORMED  Ankit Karis Juba 12/01/2016 8:09 AM

## 2016-12-01 NOTE — Progress Notes (Signed)
Recreational Therapy Session Note  Patient Details  Name: Jordan Mcintosh MRN: [id number] Date of Birth: 02/05/1956 Today's Date: 12/01/2016  Pain: no c/o Skilled Therapeutic Interventions/Progress Updates: Pt referred to TR for participation in a kitchen safety/meal prep group.    Group goals:  >Pt will actively participate in 45 minute kitchen task with supervision. >Pt will identify >3 safety concerns during simple prep activity with min cues. >After discussion, pt will identify opportunities for energy conservation during simple meal prep tasks with min cues.  Pt participated in kitchen safety/meal prep group at overall supervision ambulatory level for 55 minutes.  Pt demonstrated good safety awareness throughout task.  Education provided on energy conservation techniques and kitchen safety/home modifications & importance of hear healthy diet.   Gaylin Osoria 12/01/2016, 3:41 PM

## 2016-12-01 NOTE — Progress Notes (Signed)
Inpatient Rehabilitation Center Individual Statement of Services  Patient Name:  Jordan Mcintosh  Date:  12/01/2016  Welcome to the Inpatient Rehabilitation Center.  Our goal is to provide you with an individualized program based on your diagnosis and situation, designed to meet your specific needs.  With this comprehensive rehabilitation program, you will be expected to participate in at least 3 hours of rehabilitation therapies Monday-Friday, with modified therapy programming on the weekends.  Your rehabilitation program will include the following services:  Physical Therapy (PT), Occupational Therapy (OT), Speech Therapy (ST), 24 hour per day rehabilitation nursing, Therapeutic Recreaction (TR), Neuropsychology, Case Management (Social Worker), Rehabilitation Medicine, Nutrition Services and Pharmacy Services  Weekly team conferences will be held on Wednesdays to discuss your progress.  Your Social Worker will talk with you frequently to get your input and to update you on team discussions.  Team conferences with you and your family in attendance may also be held.  Expected length of stay:  7 to 10 days  Overall anticipated outcome:  Modified independent with supervision needed for meal preparation and stairs  Depending on your progress and recovery, your program may change. Your Social Worker will coordinate services and will keep you informed of any changes. Your Social Worker's name and contact numbers are listed  below.  The following services may also be recommended but are not provided by the Inpatient Rehabilitation Center:   Driving Evaluations  Home Health Rehabiltiation Services  Outpatient Rehabilitation Services  Vocational Rehabilitation   Arrangements will be made to provide these services after discharge if needed.  Arrangements include referral to agencies that provide these services.  Your insurance has been verified to be:  [employer] [place] Your primary doctor  is:  Dr. Feliciana Rossetti  Pertinent information will be shared with your doctor and your insurance company.  Social Worker:  Staci Acosta, LCSW  [phone] or [phone]  Information discussed with and copy given to patient by: Elvera Lennox, 12/01/2016, 12:28 PM

## 2016-12-01 NOTE — Progress Notes (Signed)
Speech Language Pathology Daily Session Note  Patient Details  Name: Jordan Mcintosh MRN: [id number] Date of Birth: 05/08/1956  Today's Date: 12/01/2016 SLP Individual Time: 4098-1191 SLP Individual Time Calculation (min): 40 min  Short Term Goals: Week 1: SLP Short Term Goal 1 (Week 1): Pt will recall speech intelligibility startegies with Mod I.  SLP Short Term Goal 2 (Week 1): Given Mod I, pt will use speech intelligibility strategies to achieve >90% speech intelligibility at the simple conversation level.  SLP Short Term Goal 3 (Week 1): Pt will recall and implement compensatory memory strategies with Mod I.   Skilled Therapeutic Interventions:  Pt was seen for skilled ST targeting communication goals.  SLP facilitated the session with a verbal reasoning task in a noisy environment to address carryover of intelligibility strategies.  Pt required overall supervision verbal cues to increase vocal intensity to achieve intelligibility in conversations.  Pt was left in recliner at the end of today's therapy session with call bell within reach.  Continue per current plan of care.       Function:  Eating Eating               Cognition Comprehension Comprehension assist level: Follows basic conversation/direction with no assist  Expression   Expression assist level: Expresses complex 90% of the time/cues < 10% of the time  Social Interaction Social Interaction assist level: Interacts appropriately with others with medication or extra time (anti-anxiety, antidepressant).  Problem Solving Problem solving assist level: Solves basic problems with no assist  Memory Memory assist level: Recognizes or recalls 90% of the time/requires cueing < 10% of the time    Pain Pain Assessment Pain Assessment: No/denies pain  Therapy/Group: Individual Therapy  Tallis Soledad, Melanee Spry 12/01/2016, 11:16 AM

## 2016-12-01 NOTE — Progress Notes (Signed)
Occupational Therapy Session Note  Patient Details  Name: Jordan Mcintosh MRN: [id number] Date of Birth: Jun 05, 1956  Today's Date: 12/01/2016 OT Concurrent Time: 0930-1030 OT Concurrent Time Calculation (min): 60 min   Short Term Goals: Week 1:  OT Short Term Goal 1 (Week 1): STG=LTG d/t ELOS  Skilled Therapeutic Interventions/Progress Updates:    Pt seen for con-current kitchen activity focusing on simple meal prep and activity tolerance. Pt received in recliner donning shoes, pt attempting to complete with R UE, able to complete with increased time.  He ambulated throughout session with supervision, no use of AD. He completed kitchen mobility and accessibility with supervision. He stood at cabinet to place grocery items into cabinet with R UE and increased time. He completed simple meal prep activity at stove level with supervision, displayed good safety awareness throughout session. Educated throughout regarding energy conservation, decreased sensation and functional implications for R UE, increased risk of CVA and importance of heart healthy diet, and d/c planning. Pt returned to room at end of session, left with all needs in reach.   Therapy Documentation Precautions:  Precautions Precautions: Fall Restrictions Weight Bearing Restrictions: No Pain:   No/ denies pain  See Function Navigator for Current Functional Status.   Therapy/Group: Individual Therapy  Lewis, Jazzlynn Rawe C 12/01/2016, 7:13 AM

## 2016-12-01 NOTE — Consult Note (Signed)
Neuropsychological Consultation   Patient:   Jordan Mcintosh   DOB:   1955/10/28  MR Number:  [id number]  Location:  [facility] [address] [facility] [id number] [place] [address] Dept: [phone] Loc: [phone]           Date of Service:   12/01/2016  Start Time:   8 AM End Time:   9 AM  Provider/Observer:  Arley Phenix, Psy.D.       Clinical Neuropsychologist       Billing Code/Service: [phone] 4 Units  Chief Complaint:    Adjustment issues following left hemisphere infarct  Reason for Service:  The patient is a 61 year old with recent left side infarct that has impacted right hand and arm function and word finding abilities.  He has a prior history of alcohol and marijuana use.  He completely stopped these around 9 years ago.  The patient had been under a lot of stress prior related to psychosocial issues with daughters.  He had stopped taking BP meds and becoming more isolated.    Current Status:  The patient is having some adjustment issues with recent stroke.  He reports he has been doing better over past few days and the fact that he is seeing real gains in functioning has really helped his mood and outlook.  Reliability of Information: Information from medical chart, review with treatment team and 1 hour face to face with patient.  Behavioral Observation: Jayton Popelka  presents as a 61 y.o.-year-old Right Caucasian Male who appeared his stated age. his dress was Appropriate and he was Well Groomed and his manners were Appropriate to the situation.  his participation was indicative of Appropriate behaviors.  There were physical disabilities noted.  he displayed an appropriate level of cooperation and motivation.     Interactions:    Active Appropriate and Attentive  Attention:   within normal limits and attention span and concentration were age appropriate  Memory:   within normal limits; recent  and remote memory intact  Visuo-spatial:  within normal limits  Speech (Volume):  normal  Speech:    Word finding issues but fluent  Thought Process:  Coherent  Though Content:  WNL; not suicidal  Orientation:   person, place, time/date and situation  Judgment:   Poor  Planning:   Poor  Affect:    Depressed  Mood:    Depressed  Insight:   Fair  Intelligence:   low  Substance Use:  There is a documented history of alcohol and marijuana abuse confirmed by the patient.  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  Medical History:   Past Medical History:  Diagnosis Date  . Hypertension    Psychiatric History:  Patient has had issues with depression and anxiety and his daily use of Marijuana in past likely self medicating for anxiety.  Family Med/Psych History:  Family History  Problem Relation Age of Onset  . Stroke Mother   . Stroke Sister   . Stroke Daughter     Risk of Suicide/Violence: low Patient denies SI or HI at this time.  Impression/DX:  Patient had been more isolated from his daughter and had stopped taking some of his medications.  He has greater understanding about the necessity of these medications.  He reports that mood has improved as he has seen his functioning improving.  Electronically Signed   _______________________ Ilean Skill, Psy.D.

## 2016-12-02 ENCOUNTER — Encounter (HOSPITAL_COMMUNITY): Payer: Self-pay

## 2016-12-02 ENCOUNTER — Inpatient Hospital Stay (HOSPITAL_COMMUNITY): Payer: BLUE CROSS/BLUE SHIELD | Admitting: Physical Therapy

## 2016-12-02 ENCOUNTER — Inpatient Hospital Stay (HOSPITAL_COMMUNITY): Payer: BLUE CROSS/BLUE SHIELD | Admitting: Occupational Therapy

## 2016-12-02 ENCOUNTER — Inpatient Hospital Stay (HOSPITAL_COMMUNITY): Payer: BLUE CROSS/BLUE SHIELD | Admitting: Speech Pathology

## 2016-12-02 DIAGNOSIS — I7100 Dissection of unspecified site of aorta: Secondary | ICD-10-CM

## 2016-12-02 DIAGNOSIS — I7101 Dissection of thoracic aorta: Secondary | ICD-10-CM

## 2016-12-02 MED ORDER — ACETAMINOPHEN 325 MG PO TABS: 325.0000 mg | ORAL_TABLET | Freq: Four times a day (QID) | ORAL | Status: AC | PRN

## 2016-12-02 NOTE — Progress Notes (Signed)
Physical Therapy Session Note  Patient Details  Name: Jordan Mcintosh MRN: [id number] Date of Birth: 1955-09-10  Today's Date: 12/02/2016 PT Individual Time: 1300-1325 PT Individual Time Calculation (min): 25 min   Short Term Goals: Week 1:  PT Short Term Goal 1 (Week 1): STG = LTG due to short ELOS.  Skilled Therapeutic Interventions/Progress Updates:   Pt received sitting in recliner and agreeable to PT. PT instructed pt in gait training in various environments including hall of rehab unit, in crowded hospital lobby, uneven cement sidewalk at [facility] entrance and up/down handicap ramp to parking garage. Pt provided intermittent supervision assist with mild LOB with change in surface height, no added assistance to prevent LOB. Pt demonstrated Mod I ambulation in controlled environment.  Patient returned too room and left sitting in  recliner with call bell in reach and all needs met.         Therapy Documentation Precautions:  Precautions Precautions: Fall Restrictions Weight Bearing Restrictions: No Pain: Pain Assessment Pain Assessment: 0-10 Pain Score: 0-No pain Pain Type: Acute pain Pain Location: Shoulder Pain Orientation: Right Pain Intervention(s): Medication (See eMAR)   See Function Navigator for Current Functional Status.   Therapy/Group: Individual Therapy  Lorie Phenix 12/02/2016, 1:28 PM

## 2016-12-02 NOTE — Patient Care Conference (Signed)
Inpatient RehabilitationTeam Conference and Plan of Care Update Date: 12/01/2016   Time: 2:30 PM    Patient Name: Jordan Mcintosh      Medical Record Number: [id number]  Date of Birth: 19-Aug-1955 Sex: Male         Room/Bed: 4M12C/4M12C-01 Payor Info: Payor: BLUE CROSS BLUE SHIELD / Plan: BCBS OTHER / Product Type: *No Product type* /    Admitting Diagnosis: admit  Admit Date/Time:  11/27/2016  6:14 PM Admission Comments: No comment available   Primary Diagnosis:  Left sided lacunar infarction [facility]) Principal Problem: Left sided lacunar infarction [facility])  Patient Active Problem List   Diagnosis Date Noted  . Prediabetes   . Benign essential HTN   . AKI (acute kidney injury) (HCC)   . History of noncompliance with medical treatment   . Hypoalbuminemia due to protein-calorie malnutrition (HCC)   . Hematoma   . Left sided lacunar infarction (HCC) 11/28/2016  . Embolic stroke (HCC) 11/27/2016  . Cerebral thrombosis with cerebral infarction 11/22/2016  . Intramural aortic hematoma (HCC) 11/21/2016    Expected Discharge Date: Expected Discharge Date: 12/03/16  Team Members Present: Physician leading conference: Dr. Maryla Morrow Social Worker Present: Staci Acosta, LCSW Nurse Present: Other (comment) Mosetta Pigeon, RN) PT Present: Katherine Mantle, PT OT Present: Rosalio Loud, OT SLP Present: Jackalyn Lombard, SLP PPS Coordinator present : Tora Duck, RN, CRRN     Current Status/Progress Goal Weekly Team Focus  Medical   Right hemiparesis and gait disorder  secondary to Left posterior corona radiata, lentiform and ext capsule infarcts  Improve Mobility, safety, periaortic AKI  See above   Bowel/Bladder   continenet of bo9wel and bladder  continue to impor ve vwith strenghtening and mobilty  contined progress with streghtening to increase independence   Swallow/Nutrition/ Hydration             ADL's   supervision, addressing RUE fine motor control and functional use  Mod I, supervision  meal prep  RUE NMR, dynamic balance, pt/family education, d/c planning   Mobility   supervision  mod I  family ed, d/c planning   Communication   supervision   mod I  intelligibility at the conversational level in distracting environments    Safety/Cognition/ Behavioral Observations  supervision   mod I   return demonstration of memory compensatory strategies   Pain   pt continues to have some generalized pain compliants of 5-6  continue with allevation of pain through comfort measures/ pain medication/ strenghting  continued improvement in pt status   Skin   skin is intact  continued skin health  improvement in overall condition and strengening    Rehab Goals Patient on target to meet rehab goals: Yes Rehab Goals Revised: none - pt's first conference *See Care Plan and progress notes for long and short-term goals.  Barriers to Discharge: Mobility, safety, periartoic hematoma, FMC, prediabetes, AKI    Possible Resolutions to Barriers:  Therapies, repeat CT next week, follow labs, IVF qhs x1    Discharge Planning/Teaching Needs:  Pt to go to his wife's home initially until he feels prepared to go home on his own.  She will help to get him to oupt therapies.  Pt is independent to direct his care.     Team Discussion:  Dr. Wynn Banker is watching pt's CBGs s pt is prediabetic.  He is also going to give pt IVF to help with his acute kidney injury and then all staff needs to encourage pt to drink  p.o. Fluids.  Pt to have f/u scan to view aortic hematoma and this may need to be done outpt, as it will likely be too soon to do it before d/c.  Pt is doing well with ST and at baseline for cognition.  He is willing to work and is on track to meet goals.  Pt is doing well with OT and right UE has really improved he is able to feed himself and is writing some.  About at mod I.  Pt is supervision to mod I with PT and he does not need any DME or family education.  Pt is using Ultram for pain per RN.   Revisions to Treatment Plan:  none   Continued Need for Acute Rehabilitation Level of Care: The patient requires daily medical management by a physician with specialized training in physical medicine and rehabilitation for the following conditions: Daily direction of a multidisciplinary physical rehabilitation program to ensure safe treatment while eliciting the highest outcome that is of practical value to the patient.: Yes Daily medical management of patient stability for increased activity during participation in an intensive rehabilitation regime.: Yes Daily analysis of laboratory values and/or radiology reports with any subsequent need for medication adjustment of medical intervention for : Neurological problems;Diabetes problems;Other  [name], Vista DeckJennifer Capps 12/02/2016, 10:16 AM

## 2016-12-02 NOTE — Progress Notes (Signed)
Physical Therapy Discharge Summary  Patient Details  Name: Jordan Mcintosh MRN: [id number] Date of Birth: 04-25-1956 Today's Date: 12/02/2016  PT Individual Time: 0830-0925 PT Individual Time Calculation (min): 55 min   Pt performed gait in home and controlled environments as well as on uneven surfaces with mod I, pt able to self correct any LOB.  Stair negotiation without railings with initial supervision with cues for safety, progressing to mod I throughout session.  Standing balance on foam with UE handwriting task with pt improving coordination and strength of Rt UE, just needs increased time.  [place] "B" exercises performed for dynamic gait tasks, pt demo'd and verbalized understanding of written HEP.  Kinetron for LE strengthening 2 x 2 minutes.  Berg balance test re-test with pt improving from 44/56 to 51/56, decreased risk of falls. Pt states he feels excited for d/c home tomorrow.  Patient has met 7 of 7 long term goals due to improved activity tolerance, improved balance, improved postural control, increased strength, ability to compensate for deficits, functional use of  right upper extremity and right lower extremity, improved awareness and improved coordination.  Patient to discharge at an ambulatory level Modified Independent.    Reasons goals not met: n/a  Recommendation:  Patient will benefit from ongoing skilled PT services in outpatient setting to continue to advance safe functional mobility, address ongoing impairments in coordination, strength, balance, and minimize fall risk.  Equipment: No equipment provided  Reasons for discharge: treatment goals met and discharge from hospital  Patient/family agrees with progress made and goals achieved: Yes  PT Discharge Precautions/Restrictions Precautions Precautions: Fall Restrictions Weight Bearing Restrictions: No Pain Pain Assessment Pain Assessment: No/denies pain  Cognition Overall Cognitive Status: Within  Functional Limits for tasks assessed Arousal/Alertness: Awake/alert Orientation Level: Oriented X4 Attention: Selective Selective Attention: Appears intact Memory: Appears intact (baseline ) Awareness: Appears intact Problem Solving: Appears intact Safety/Judgment: Appears intact Sensation Sensation Light Touch: Appears Intact Proprioception: Appears Intact (bilat LE) Coordination Gross Motor Movements are Fluid and Coordinated: Yes Fine Motor Movements are Fluid and Coordinated: No Motor  Motor Motor - Discharge Observations: mild hemiplegia Rt LE and UE   Trunk/Postural Assessment  Cervical Assessment Cervical Assessment: Within Functional Limits Thoracic Assessment Thoracic Assessment:  (rounded shoulders) Lumbar Assessment Lumbar Assessment:  (posterior pelvic tilt) Postural Control Postural Control: Within Functional Limits  Balance Berg Balance Test Sit to Stand: Able to stand without using hands and stabilize independently Standing Unsupported: Able to stand safely 2 minutes Sitting with Back Unsupported but Feet Supported on Floor or Stool: Able to sit safely and securely 2 minutes Stand to Sit: Sits safely with minimal use of hands Transfers: Able to transfer safely, minor use of hands Standing Unsupported with Eyes Closed: Able to stand 10 seconds safely Standing Ubsupported with Feet Together: Able to place feet together independently and stand 1 minute safely From Standing, Reach Forward with Outstretched Arm: Can reach confidently >25 cm (10") From Standing Position, Pick up Object from Floor: Able to pick up shoe safely and easily From Standing Position, Turn to Look Behind Over each Shoulder: Looks behind from both sides and weight shifts well Turn 360 Degrees: Able to turn 360 degrees safely but slowly Standing Unsupported, Alternately Place Feet on Step/Stool: Able to stand independently and complete 8 steps >20 seconds Standing Unsupported, One Foot in  Front: Able to plae foot ahead of the other independently and hold 30 seconds Standing on One Leg: Able to lift leg independently and hold 5-10 seconds  Total Score: 51 Extremity Assessment  RLE Assessment RLE Assessment:  (WFL except 3/5 Rt ankle) LLE Assessment LLE Assessment: Within Functional Limits   See Function Navigator for Current Functional Status.  DONAWERTH,KAREN 12/02/2016, 9:28 AM

## 2016-12-02 NOTE — Progress Notes (Signed)
  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  Objective: Vital signs in last 24 hours: Temp:  [98.4 F (36.9 C)-98.6 F (37 C)] 98.6 F (37 C) (05/03 0353) Pulse Rate:  [64-69] 64 (05/03 0353) Resp:  [17-18] 17 (05/03 0353) BP: (115-142)/(62-69) 142/69 (05/03 0353) SpO2:  [97 %-98 %] 98 % (05/03 0353) Weight:  [215 lb 2.7 oz (97.6 kg)-218 lb 11.1 oz (99.2 kg)] 215 lb 2.7 oz (97.6 kg) (05/03 0500)  Hemodynamic parameters for last 24 hours:  stable  Intake/Output from previous day: 05/02 0701 - 05/03 0700 In: 840 [P.O.:840] Out: 1 [Urine:1] Intake/Output this shift: Total I/O In: 240 [P.O.:240] Out: -   Exam  Improved right upper extremity strength and right grip strength Sinus rhythm No murmur No peripheral edema Pulses in all extremities  Lab Results: No results for input(s): WBC, HGB, HCT, PLT in the last 72 hours. BMET:  Recent Labs  12/01/16 0659  NA 136  K 4.3  CL 102  CO2 26  GLUCOSE 91  BUN 27*  CREATININE 1.33*  CALCIUM 9.1    PT/INR: No results for input(s): LABPROT, INR in the last 72 hours. ABG No results found for: PHART, HCO3, TCO2, ACIDBASEDEF, O2SAT CBG (last 3)  No results for input(s): GLUCAP in the last 72 hours.  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

## 2016-12-02 NOTE — Progress Notes (Signed)
Occupational Therapy Discharge Summary  Patient Details  Name: Jordan Mcintosh MRN: [id number] Date of Birth: 01/13/1956  Patient has met 46 of 14 long term goals due to improved activity tolerance, improved balance, ability to compensate for deficits, functional use of  RIGHT upper extremity, improved awareness and improved coordination.  Patient to discharge at overall Modified Independent level.  Patient's care partner is independent to provide the necessary intermittent assistance at discharge.  Pt overall Mod I and able to verbalize safety concerns with meal prep and recommendation for supervision.  Pt able to direct his care if assist is needed.  Reasons goals not met: N/A  Recommendation:  Patient will benefit from ongoing skilled OT services in outpatient setting to continue to advance functional skills in the area of BADL, iADL and Reduce care partner burden.  Equipment: tub transfer bench  Reasons for discharge: treatment goals met and discharge from hospital  Patient/family agrees with progress made and goals achieved: Yes  OT Discharge Precautions/Restrictions  Precautions Precautions: Fall General   Vital Signs Therapy Vitals Temp: 98 F (36.7 C) Temp Source: Oral Pulse Rate: 64 Resp: 18 BP: (!) 104/58 Patient Position (if appropriate): Sitting Oxygen Therapy SpO2: 99 % O2 Device: Not Delivered Pain Pain Assessment Pain Assessment: No/denies pain ADL  See Function Navigator Vision/Perception  Vision- Assessment Eye Alignment:  (Rt eye oriented up) Additional Comments: vision does not impact mobility or reading  Cognition Overall Cognitive Status: Within Functional Limits for tasks assessed Arousal/Alertness: Awake/alert Attention: Selective Selective Attention: Appears intact Memory: Appears intact Awareness: Appears intact Problem Solving: Appears intact Safety/Judgment: Appears intact Sensation Sensation Light Touch: Appears Intact Stereognosis:  Appears Intact Proprioception: Appears Intact Coordination Gross Motor Movements are Fluid and Coordinated: Yes Fine Motor Movements are Fluid and Coordinated: No Coordination and Movement Description: slow Finger Nose Finger Test: slow and uncoordinated with RUE Motor  Motor Motor - Discharge Observations: mild hemiplegia Rt LE and UE Extremity/Trunk Assessment RUE Assessment RUE Assessment: Within Functional Limits (decreased [place] and loose gross grasp) LUE Assessment LUE Assessment: Within Functional Limits   See Function Navigator for Current Functional Status.  Simonne Come 12/02/2016, 3:20 PM

## 2016-12-02 NOTE — Progress Notes (Signed)
Speech Language Pathology Discharge Summary  Patient Details  Name: Jordan Mcintosh MRN: [id number] Date of Birth: 01-10-1956  Today's Date: 12/02/2016 SLP Individual Time: 62-1430 SLP Individual Time Calculation (min): 45 min   Skilled Therapeutic Interventions:  Pt was seen for skilled ST targeting goals for communication.  SLP facilitated the session with a novel card game to address use of intelligibility strategies while multitasking.  Pt utilized increased vocal intensity and overarticulation to achieve intelligibility at the conversational level with mod I while engaged in simultaneous activities.  Pt was returned to room and is now mod I for ambulation per report.  Discussed recommendations for ST follow up at next level of care to address speech intelligibility .  Pt in agreement.  Pt is ready for discharge tomorrow.      Patient has met 2 of 2 long term goals.  Patient to discharge at overall   level.  Reasons goals not met:     Clinical Impression/Discharge Summary:  Pt has made functional gains while inpatient and is discharging having met 2 out of 2 short term goals.  Pt is currently mod I for use of memory compensatory strategies to recall daily information and mod I for use of intelligibility strategies at the conversational level. Pt education is complete at this time and pt is discharging home with assistance from his wife (separated).  Pt would benefit from ST follow up at the outpatient level to continue to address dysarthria.    Care Partner:  Caregiver Able to Provide Assistance: Yes  Type of Caregiver Assistance: Physical;Cognitive  Recommendation:  Outpatient SLP  Rationale for SLP Follow Up: Maximize functional communication   Equipment: none recommended by SLP    Reasons for discharge: Discharged from hospital   Patient/Family Agrees with Progress Made and Goals Achieved: Yes   Function:  Eating Eating               Cognition Comprehension  Comprehension assist level: Follows complex conversation/direction with no assist  Expression   Expression assist level: Expresses complex ideas: With extra time/assistive device  Social Interaction Social Interaction assist level: Interacts appropriately with others with medication or extra time (anti-anxiety, antidepressant).  Problem Solving Problem solving assist level: Solves basic problems with no assist  Memory Memory assist level: Recognizes or recalls 90% of the time/requires cueing < 10% of the time   Emilio Math 12/02/2016, 4:21 PM

## 2016-12-02 NOTE — Progress Notes (Signed)
Occupational Therapy Session Note  Patient Details  Name: Jordan Mcintosh MRN: [id number] Date of Birth: April 13, 1956  Today's Date: 12/02/2016 OT Individual Time: 0730-0830 OT Individual Time Calculation (min): 60 min    Short Term Goals: Week 1:  OT Short Term Goal 1 (Week 1): STG=LTG d/t ELOS  Skilled Therapeutic Interventions/Progress Updates:    Completed ADL retraining at overall Mod I level.  Pt gathered all clothing and supplies prior to bathing in room shower at sit > stand level.  Pt doffed underwear and pants in standing without assist or LOB.  Completed tub/shower transfer in ADL apt with use of tub transfer bench Mod I.  9 hole peg test with Lt: 33 seconds and Rt: 1:32.  Provided pt with fine motor control handouts to continue to address strength and control in RUE.  Reiterated functional use of RUE during familiar and motivating tasks to continue to improve and increase motor control.  Therapy Documentation Precautions:  Precautions Precautions: Fall Restrictions Weight Bearing Restrictions: No Pain:  Pt with no c/o pain  See Function Navigator for Current Functional Status.   Therapy/Group: Individual Therapy  Rosalio LoudHOXIE, [name] 12/02/2016, 7:58 AM

## 2016-12-02 NOTE — Progress Notes (Signed)
Recreational Therapy Discharge Summary Patient Details  Name: Jordan Mcintosh MRN: [id number] Date of Birth: 1955-11-15 Today's Date: 12/02/2016  Comments on progress toward goals: Pt has made excellent progress toward goals and is ready for discharge home on 5/4.  Pt participated in TR kitchen/meal prep group at supervision level.  Education provided on importance of staying active, leisure eduction-identifying potential leisure opportunities, completing activity analysis with potential modifications, community pursuits.  Pt is motivated to make lifestyle changes post discharge.   Reasons for discharge: discharge from hospital Patient/family agrees with progress made and goals achieved: Yes  [name] 12/02/2016, 11:23 AM

## 2016-12-02 NOTE — Progress Notes (Signed)
Social Work Patient ID: Jordan Mcintosh, male   DOB: 02-24-1956, 61 y.o.   MRN: [id number]   CSW met with pt to update him on team conference discussion and pt was on phone with his wife, so CSW was able to update them together.  She will be ready for him to d/c on 12-03-16, but was surprised it was earlier than expected.  She is recovering from [place], so she will have someone come to get pt that day as she is not driving yet.  She can take pt to outpt therapy starting next week.  Wife has already purchased a tub transfer bench and that is the only DME pt needs.  CSW will set pt up with outpt therapy at [facility] will continue to follow and assist as needed.

## 2016-12-02 NOTE — Progress Notes (Addendum)
Nutrition Brief Note  RD consulted for diet education. Full diet education regarding heart healthy/carbohydrate modified was given Monday, 4/30 (see note for further details). During time of visit, pt reports no questions regarding his diet currently and the diet he will be on once discharged. Pt expressed understanding of the diet upon discharge. No further interventions warranted at this time.   Roslyn SmilingStephanie Tyronica [place], MS, RD, LDN Pager # [phone] After hours/ weekend pager # [phone]

## 2016-12-02 NOTE — Progress Notes (Signed)
[facility] PHYSICAL MEDICINE & REHABILITATION     PROGRESS NOTE  Subjective/Complaints:  Pt seen laying in bed this AM.  He states he did not sleep well overnight, but cannot identify a reason.  He is looking forward to discharge tomorrow.  ROS: Denies CP, SOB, N/V/D.  Objective: Vital Signs: Blood pressure (!) 142/69, pulse 64, temperature 98.6 F (37 C), temperature source Oral, resp. rate 17, height 5\' 11"  (1.803 m), weight 97.6 kg (215 lb 2.7 oz), SpO2 98 %. Ct Chest Wo Contrast  Result Date: 12/01/2016 CLINICAL DATA:  Follow-up intramural aortic hematoma. EXAM: CT CHEST WITHOUT CONTRAST TECHNIQUE: Multidetector CT imaging of the chest was performed following the standard protocol without IV contrast. COMPARISON:  None. FINDINGS: Cardiovascular: Noncontrast appearance of the aortic arch is similar to prior study. There is indistinctness of the fat adjacent to the distal aortic arch near the aortic isthmus, at the site of previously seen penetrating ulcer. Suspect small intramural hematoma at this level which is not seen below the proximal descending thoracic aorta. Coronary artery calcifications in the left anterior descending and left circumflex coronary arteries as well as left main coronary artery. Mediastinum/Nodes: No mediastinal, hilar, or axillary adenopathy. Small hiatal hernia. Lungs/Pleura: Lungs are clear. No focal airspace opacities or suspicious nodules. No effusions. Upper Abdomen: Imaging into the upper abdomen shows no acute findings. Scattered calcifications in the liver and spleen compatible with old granulomatous disease. Musculoskeletal: No acute bony abnormality. IMPRESSION: Indistinctness in the region of the distal aortic arch/ aortic isthmus in the region of previously seen penetrating ulcer. There appears to be a small amount of low-density intramural hematoma adjacent to this portion of the aorta, which does not extend below the proximal descending thoracic aorta. Seven  similar appearance to prior study. Coronary artery disease. Small hiatal hernia. Electronically Signed   By: Charlett NoseKevin  Dover M.D.   On: 12/01/2016 12:19   No results for input(s): WBC, HGB, HCT, PLT in the last 72 hours.  Recent Labs  12/01/16 0659  NA 136  K 4.3  CL 102  GLUCOSE 91  BUN 27*  CREATININE 1.33*  CALCIUM 9.1   CBG (last 3)  No results for input(s): GLUCAP in the last 72 hours.  Wt Readings from Last 3 Encounters:  12/02/16 97.6 kg (215 lb 2.7 oz)  11/25/16 96.6 kg (213 lb)    Physical Exam:  BP (!) 142/69 (BP Location: Right Arm)   Pulse 64   Temp 98.6 F (37 C) (Oral)   Resp 17   Ht 5\' 11"  (1.803 m)   Wt 97.6 kg (215 lb 2.7 oz)   SpO2 98%   BMI 30.01 kg/m  Constitutional: He appears well-developed and well-nourished.  HENT: Normocephalic and atraumatic.  Eyes: EOMI. No discharge.  Cardiovascular: RRR.  No JVD. Respiratory: Effort normal and breath sounds normal.  GI: Soft. Bowel sounds are normal.  Musculoskeletal: He exhibits no edema or tenderness.  Neurological: He is alert and oriented x3.  Right facial weakness with dysarthria Able to follow commands.  Motor: RUE 4/5 proximal to distal (improving), [facility] (improving) RLE: 4+/5 proximal to distal  LUE/LLE: 5/5 proximal to distal  Skin: Skin is warm and dry.  Psychiatric: He has a normal mood and affect.   Assessment/Plan: 1. Functional deficits secondary to Left posterior corona radiata, lentiform and ext capsule infarcts which require 3+ hours per day of interdisciplinary therapy in a comprehensive inpatient rehab setting. Physiatrist is providing close team supervision and 24  hour management of active medical problems listed below. Physiatrist and rehab team continue to assess barriers to discharge/monitor patient progress toward functional and medical goals.  Function:  Bathing Bathing position   Position: Shower  Bathing parts Body parts bathed by patient: Right arm, Left lower  leg, Left arm, Chest, Abdomen, Buttocks, Front perineal area, Right upper leg, Left upper leg, Right lower leg, Back Body parts bathed by helper: Back  Bathing assist Assist Level: More than reasonable time      Upper Body Dressing/Undressing Upper body dressing   What is the patient wearing?: Pull over shirt/dress     Pull over shirt/dress - Perfomed by patient: Thread/unthread right sleeve, Thread/unthread left sleeve, Put head through opening, Pull shirt over trunk          Upper body assist Assist Level: More than reasonable time      Lower Body Dressing/Undressing Lower body dressing   What is the patient wearing?: Underwear, Pants, Socks, Shoes Underwear - Performed by patient: Thread/unthread right underwear leg, Thread/unthread left underwear leg, Pull underwear up/down   Pants- Performed by patient: Thread/unthread right pants leg, Thread/unthread left pants leg, Pull pants up/down Pants- Performed by helper: Pull pants up/down (pants tight) Non-skid slipper socks- Performed by patient: Don/doff right sock, Don/doff left sock   Socks - Performed by patient: Don/doff right sock, Don/doff left sock   Shoes - Performed by patient: Don/doff right shoe, Don/doff left shoe, Fasten right, Fasten left            Lower body assist Assist for lower body dressing: More than reasonable time   Set up : To obtain clothing/put away  Toileting Toileting   Toileting steps completed by patient: Performs perineal hygiene, Adjust clothing prior to toileting, Adjust clothing after toileting   Toileting Assistive Devices: Grab bar or rail  Toileting assist Assist level: No help/no cues   Transfers Chair/bed transfer   Chair/bed transfer method: Ambulatory Chair/bed transfer assist level: No Help, no cues, assistive device, takes more than a reasonable amount of time       Locomotion Ambulation     Max distance: 1000 Assist level: Supervision or verbal cues   Wheelchair           Cognition Comprehension Comprehension assist level: Follows basic conversation/direction with no assist  Expression Expression assist level: Expresses complex 90% of the time/cues < 10% of the time  Social Interaction Social Interaction assist level: Interacts appropriately with others with medication or extra time (anti-anxiety, antidepressant).  Problem Solving Problem solving assist level: Solves basic problems with no assist  Memory Memory assist level: Recognizes or recalls 90% of the time/requires cueing < 10% of the time    Medical Problem List and Plan: 1.  Right hemiparesis and gait disorder  secondary to Left posterior corona radiata, lentiform and ext capsule infarcts  Cont CIR  Plan for d/c tomorrow. 2.  DVT Prophylaxis/Anticoagulation: Mechanical: Sequential compression devices, below knee Bilateral lower extremities 3. Pain Management: N/A 4. Mood: LCSW to follow for evaluation and support.  5. Neuropsych: This patient is capable of making decisions on his own behalf. 6. Skin/Wound Care: routine pressure relief measures. He is able to move reposition without difficulty.  7. Fluids/Electrolytes/Nutrition: Monitor I/Os.  8. Intramural aortic Hematoma and penetrating ulcer: BP goals < 140.  Repeat CT reviewed, stable/improved.  Will discuss recs with CT Surg 9. Uncontrolled HTN: Monitor BP qid. Continue norvasc, lasix, cozaar and metoprolol--catapres added 4/27   Controlled 5/3 10.  Dyslipidemia: On Lipitor.  11. Prediabetes: Hgb A1C- 5.9. Educate patient on appropriate diet.   Controlled 5/3 12. Hypoalbuminemia  Supplement initiated 4/30 13. AKI  Cr 1.33 on 5/2  Encourage fluids  Cont to monitor  IVF night of 5/2  Labs ordered for tomorrow 14. History of noncompliance  Educate  LOS (Days) 5 A FACE TO FACE EVALUATION WAS PERFORMED  Shontay Wallner Karis Juba 12/02/2016 8:15 AM

## 2016-12-02 NOTE — Progress Notes (Deleted)
Physical Therapy Discharge Summary  Patient Details  Name: Jordan Mcintosh MRN: [id number] Date of Birth: Nov 28, 1955  Today's Date: 12/02/2016 PT Individual Time: 0830-0925 PT Individual Time Calculation (min): 55 min   Pt performed gait in home and controlled environments as well as on uneven surfaces with mod I, pt able to self correct any LOB.  Stair negotiation without railings with initial supervision with cues for safety, progressing to mod I throughout session.  Standing balance on foam with UE handwriting task with pt improving coordination and strength of Rt UE, just needs increased time.  Otago "B" exercises performed for dynamic gait tasks, pt demo'd and verbalized understanding of written HEP.  Kinetron for LE strengthening 2 x 2 minutes.  Berg balance test re-test with pt improving from 44/56 to 51/56, decreased risk of falls. Pt states he feels excited for d/c home tomorrow.  Patient has met 7 of 7 long term goals due to improved activity tolerance, improved balance, improved postural control, increased strength, ability to compensate for deficits, functional use of  right upper extremity and right lower extremity, improved awareness and improved coordination.  Patient to discharge at an ambulatory level Modified Independent.    Reasons goals not met: n/a  Recommendation:  Patient will benefit from ongoing skilled PT services in outpatient setting to continue to advance safe functional mobility, address ongoing impairments in coordination, strength, balance, and minimize fall risk.  Equipment: No equipment provided  Reasons for discharge: treatment goals met and discharge from hospital  Patient/family agrees with progress made and goals achieved: Yes  PT Discharge Precautions/Restrictions Precautions Precautions: Fall Restrictions Weight Bearing Restrictions: No Pain Pain Assessment Pain Assessment: No/denies pain  Cognition Overall Cognitive Status: Within Functional  Limits for tasks assessed Arousal/Alertness: Awake/alert Orientation Level: Oriented X4 Attention: Selective Selective Attention: Appears intact Memory: Appears intact (baseline ) Awareness: Appears intact Problem Solving: Appears intact Safety/Judgment: Appears intact Sensation Sensation Light Touch: Appears Intact Proprioception: Appears Intact (bilat LE) Coordination Gross Motor Movements are Fluid and Coordinated: Yes Fine Motor Movements are Fluid and Coordinated: No Motor  Motor Motor - Discharge Observations: mild hemiplegia Rt LE and UE   Trunk/Postural Assessment  Cervical Assessment Cervical Assessment: Within Functional Limits Thoracic Assessment Thoracic Assessment:  (rounded shoulders) Lumbar Assessment Lumbar Assessment:  (posterior pelvic tilt) Postural Control Postural Control: Within Functional Limits  Balance Berg Balance Test Sit to Stand: Able to stand without using hands and stabilize independently Standing Unsupported: Able to stand safely 2 minutes Sitting with Back Unsupported but Feet Supported on Floor or Stool: Able to sit safely and securely 2 minutes Stand to Sit: Sits safely with minimal use of hands Transfers: Able to transfer safely, minor use of hands Standing Unsupported with Eyes Closed: Able to stand 10 seconds safely Standing Ubsupported with Feet Together: Able to place feet together independently and stand 1 minute safely From Standing, Reach Forward with Outstretched Arm: Can reach confidently >25 cm (10") From Standing Position, Pick up Object from Floor: Able to pick up shoe safely and easily From Standing Position, Turn to Look Behind Over each Shoulder: Looks behind from both sides and weight shifts well Turn 360 Degrees: Able to turn 360 degrees safely but slowly Standing Unsupported, Alternately Place Feet on Step/Stool: Able to stand independently and complete 8 steps >20 seconds Standing Unsupported, One Foot in Front: Able to  plae foot ahead of the other independently and hold 30 seconds Standing on One Leg: Able to lift leg independently and hold 5-10 seconds  Total Score: 51 Extremity Assessment      RLE Assessment RLE Assessment:  (WFL except 3/5 Rt ankle) LLE Assessment LLE Assessment: Within Functional Limits   See Function Navigator for Current Functional Status.  Mont Jagoda 12/02/2016, 9:28 AM

## 2016-12-03 ENCOUNTER — Inpatient Hospital Stay (HOSPITAL_COMMUNITY): Payer: BLUE CROSS/BLUE SHIELD

## 2016-12-03 ENCOUNTER — Inpatient Hospital Stay (HOSPITAL_COMMUNITY): Payer: BLUE CROSS/BLUE SHIELD | Admitting: Occupational Therapy

## 2016-12-03 LAB — BASIC METABOLIC PANEL
Anion gap: 9 (ref 5–15)
BUN: 21 mg/dL — AB (ref 6–20)
CHLORIDE: 103 mmol/L (ref 101–111)
CO2: 25 mmol/L (ref 22–32)
CREATININE: 1.02 mg/dL (ref 0.61–1.24)
Calcium: 8.7 mg/dL — ABNORMAL LOW (ref 8.9–10.3)
GFR calc Af Amer: >60 mL/min (ref >60)
GLUCOSE: 87 mg/dL (ref 65–99)
POTASSIUM: 3.9 mmol/L (ref 3.5–5.1)
Sodium: 137 mmol/L (ref 135–145)

## 2016-12-03 MED ORDER — PANTOPRAZOLE SODIUM 40 MG PO TBEC: 40.0000 mg | DELAYED_RELEASE_TABLET | Freq: Two times a day (BID) | ORAL | 0 refills | Status: AC

## 2016-12-03 MED ORDER — ATORVASTATIN CALCIUM 40 MG PO TABS: 40.0000 mg | ORAL_TABLET | Freq: Every day | ORAL | 0 refills | Status: AC

## 2016-12-03 MED ORDER — METOPROLOL TARTRATE 100 MG PO TABS: 100.0000 mg | ORAL_TABLET | Freq: Two times a day (BID) | ORAL | 0 refills | Status: AC

## 2016-12-03 MED ORDER — FUROSEMIDE 40 MG PO TABS: 40.0000 mg | ORAL_TABLET | Freq: Every day | ORAL | 0 refills | Status: AC

## 2016-12-03 MED ORDER — ASPIRIN 81 MG PO CHEW: 81.0000 mg | CHEWABLE_TABLET | Freq: Every day | ORAL | 0 refills | Status: AC

## 2016-12-03 MED ORDER — AMLODIPINE BESYLATE 5 MG PO TABS: 5.0000 mg | ORAL_TABLET | Freq: Every day | ORAL | 0 refills | Status: AC

## 2016-12-03 MED ORDER — LOSARTAN POTASSIUM 100 MG PO TABS: 150.0000 mg | ORAL_TABLET | Freq: Every day | ORAL | 0 refills | Status: DC

## 2016-12-03 MED ORDER — TRAMADOL HCL 50 MG PO TABS: 50.0000 mg | ORAL_TABLET | Freq: Two times a day (BID) | ORAL | 0 refills | Status: AC | PRN

## 2016-12-03 MED ORDER — CLONIDINE HCL 0.2 MG/24HR TD PTWK: 0.2000 mg | MEDICATED_PATCH | TRANSDERMAL | 0 refills | Status: AC

## 2016-12-03 NOTE — Progress Notes (Signed)
Pt discharged to home accompanied by wife.   VSS, denies pain/discomfort.   Verbalizes understanding of d/c instructions given by [name] Love,PA-C.

## 2016-12-03 NOTE — Progress Notes (Signed)
Jordan Mcintosh PHYSICAL MEDICINE & REHABILITATION     PROGRESS NOTE  Subjective/Complaints:  Pt seen sitting up in his chair this AM.  He is happy to be going home today.   ROS: Denies CP, SOB, N/V/D.  Objective: Vital Signs: Blood pressure 136/68, pulse 65, temperature 98 F (36.7 C), temperature source Oral, resp. rate 18, height 5\' 11"  (1.803 m), weight 97.8 kg (215 lb 9.8 oz), SpO2 99 %. Ct Chest Wo Contrast  Result Date: 12/01/2016 CLINICAL DATA:  Follow-up intramural aortic hematoma. EXAM: CT CHEST WITHOUT CONTRAST TECHNIQUE: Multidetector CT imaging of the chest was performed following the standard protocol without IV contrast. COMPARISON:  None. FINDINGS: Cardiovascular: Noncontrast appearance of the aortic arch is similar to prior study. There is indistinctness of the fat adjacent to the distal aortic arch near the aortic isthmus, at the site of previously seen penetrating ulcer. Suspect small intramural hematoma at this level which is not seen below the proximal descending thoracic aorta. Coronary artery calcifications in the left anterior descending and left circumflex coronary arteries as well as left main coronary artery. Mediastinum/Nodes: No mediastinal, hilar, or axillary adenopathy. Small hiatal hernia. Lungs/Pleura: Lungs are clear. No focal airspace opacities or suspicious nodules. No effusions. Upper Abdomen: Imaging into the upper abdomen shows no acute findings. Scattered calcifications in the liver and spleen compatible with old granulomatous disease. Musculoskeletal: No acute bony abnormality. IMPRESSION: Indistinctness in the region of the distal aortic arch/ aortic isthmus in the region of previously seen penetrating ulcer. There appears to be a small amount of low-density intramural hematoma adjacent to this portion of the aorta, which does not extend below the proximal descending thoracic aorta. Seven similar appearance to prior study. Coronary artery disease. Small hiatal  hernia. Electronically Signed   By: Charlett NoseKevin  Dover M.D.   On: 12/01/2016 12:19   No results for input(s): WBC, HGB, HCT, PLT in the last 72 hours.  Recent Labs  12/01/16 0659 12/03/16 0514  NA 136 137  K 4.3 3.9  CL 102 103  GLUCOSE 91 87  BUN 27* 21*  CREATININE 1.33* 1.02  CALCIUM 9.1 8.7*   CBG (last 3)  No results for input(s): GLUCAP in the last 72 hours.  Wt Readings from Last 3 Encounters:  12/03/16 97.8 kg (215 lb 9.8 oz)  11/25/16 96.6 kg (213 lb)    Physical Exam:  BP 136/68 (BP Location: Right Arm)   Pulse 65   Temp 98 F (36.7 C) (Oral)   Resp 18   Ht 5\' 11"  (1.803 m)   Wt 97.8 kg (215 lb 9.8 oz)   SpO2 99%   BMI 30.07 kg/m  Constitutional: He appears well-developed and well-nourished.  HENT: Normocephalic and atraumatic.  Eyes: EOMI. No discharge.  Cardiovascular: RRR.  No JVD. Respiratory: Effort normal and breath sounds normal.  GI: Soft. Bowel sounds are normal.  Musculoskeletal: He exhibits no edema or tenderness.  Neurological: He is alert and oriented.  Right facial weakness with dysarthria Able to follow commands.  Motor: RUE 4/5 proximal to distal (improving), Decrease in [facility] (improving) RLE: 4+/5 proximal to distal  LUE/LLE: 5/5 proximal to distal  Skin: Skin is warm and dry.  Psychiatric: He has a normal mood and affect.   Assessment/Plan: 1. Functional deficits secondary to Left posterior corona radiata, lentiform and ext capsule infarcts which require 3+ hours per day of interdisciplinary therapy in a comprehensive inpatient rehab setting. Physiatrist is providing close team supervision and 24 hour management of active  medical problems listed below. Physiatrist and rehab team continue to assess barriers to discharge/monitor patient progress toward functional and medical goals.  Function:  Bathing Bathing position   Position: Shower  Bathing parts Body parts bathed by patient: Right arm, Left lower leg, Left arm, Chest, Abdomen,  Buttocks, Front perineal area, Right upper leg, Left upper leg, Right lower leg, Back Body parts bathed by helper: Back  Bathing assist Assist Level: More than reasonable time      Upper Body Dressing/Undressing Upper body dressing   What is the patient wearing?: Pull over shirt/dress     Pull over shirt/dress - Perfomed by patient: Thread/unthread right sleeve, Thread/unthread left sleeve, Put head through opening, Pull shirt over trunk          Upper body assist Assist Level: More than reasonable time      Lower Body Dressing/Undressing Lower body dressing   What is the patient wearing?: Underwear, Pants, Socks, Shoes Underwear - Performed by patient: Thread/unthread right underwear leg, Thread/unthread left underwear leg, Pull underwear up/down   Pants- Performed by patient: Thread/unthread right pants leg, Thread/unthread left pants leg, Pull pants up/down Pants- Performed by helper: Pull pants up/down (pants tight) Non-skid slipper socks- Performed by patient: Don/doff right sock, Don/doff left sock   Socks - Performed by patient: Don/doff right sock, Don/doff left sock   Shoes - Performed by patient: Don/doff right shoe, Don/doff left shoe, Fasten right, Fasten left            Lower body assist Assist for lower body dressing: More than reasonable time   Set up : To obtain clothing/put away  Toileting Toileting   Toileting steps completed by patient: Adjust clothing prior to toileting, Performs perineal hygiene, Adjust clothing after toileting   Toileting Assistive Devices: Grab bar or rail  Toileting assist Assist level: No help/no cues   Transfers Chair/bed transfer   Chair/bed transfer method: Ambulatory Chair/bed transfer assist level: No Help, no cues, assistive device, takes more than a reasonable amount of time       Locomotion Ambulation     Max distance: 1000 Assist level: No help, No cues, assistive device, takes more than a reasonable amount of  time   Wheelchair          Cognition Comprehension Comprehension assist level: Follows complex conversation/direction with no assist  Expression Expression assist level: Expresses complex ideas: With extra time/assistive device  Social Interaction Social Interaction assist level: Interacts appropriately with others with medication or extra time (anti-anxiety, antidepressant).  Problem Solving Problem solving assist level: Solves basic problems with no assist  Memory Memory assist level: Recognizes or recalls 90% of the time/requires cueing < 10% of the time    Medical Problem List and Plan: 1.  Right hemiparesis and gait disorder  secondary to Left posterior corona radiata, lentiform and ext capsule infarcts  D/c today.  Will see patient for transitional care management in 1-2 weeks 2.  DVT Prophylaxis/Anticoagulation: Mechanical: Sequential compression devices, below knee Bilateral lower extremities 3. Pain Management: N/A 4. Mood: LCSW to follow for evaluation and support.  5. Neuropsych: This patient is capable of making decisions on his own behalf. 6. Skin/Wound Care: routine pressure relief measures. He is able to move reposition without difficulty.  7. Fluids/Electrolytes/Nutrition: Monitor I/Os.  8. Intramural aortic Hematoma and penetrating ulcer: BP goals < 140.  Repeat CT reviewed, improved.  Appreciate CT Surg recs- follow up as outpt 9. Uncontrolled HTN: Monitor BP qid. Continue norvasc, lasix,  cozaar and metoprolol--catapres added 4/27   Controlled 5/4 10. Dyslipidemia: On Lipitor.  11. Prediabetes: Hgb A1C- 5.9. Educate patient on appropriate diet.   Controlled 5/4 12. Hypoalbuminemia  Supplement initiated 4/30 13. AKI  Cr 1.02 on 5/4  Encourage fluids  Cont to monitor  IVF night of 5/2  Will need to ensure adequate PO intake as outpt 14. History of noncompliance  Educate  LOS (Days) 6 A FACE TO FACE EVALUATION WAS PERFORMED  Ankit Karis Juba 12/03/2016 8:19  AM

## 2016-12-03 NOTE — Discharge Summary (Signed)
Physician Discharge Summary  Patient ID: Jordan Mcintosh MRN: [id number] DOB/AGE: Apr 28, 1956 61 y.o.  Admit date: 11/27/2016 Discharge date: 12/03/2016  Discharge Diagnoses:  Principal Problem:   Left sided lacunar infarction [facility]) Active Problems:   Embolic stroke (HCC)   Prediabetes   Benign essential HTN   AKI (acute kidney injury) (HCC)   History of noncompliance with medical treatment   Hypoalbuminemia due to protein-calorie malnutrition (HCC)   Hematoma   Discharged Condition: stable   Significant Diagnostic Studies:  Ct Chest Wo Contrast  Result Date: 12/01/2016 CLINICAL DATA:  Follow-up intramural aortic hematoma. EXAM: CT CHEST WITHOUT CONTRAST TECHNIQUE: Multidetector CT imaging of the chest was performed following the standard protocol without IV contrast. COMPARISON:  None. FINDINGS: Cardiovascular: Noncontrast appearance of the aortic arch is similar to prior study. There is indistinctness of the fat adjacent to the distal aortic arch near the aortic isthmus, at the site of previously seen penetrating ulcer. Suspect small intramural hematoma at this level which is not seen below the proximal descending thoracic aorta. Coronary artery calcifications in the left anterior descending and left circumflex coronary arteries as well as left main coronary artery. Mediastinum/Nodes: No mediastinal, hilar, or axillary adenopathy. Small hiatal hernia. Lungs/Pleura: Lungs are clear. No focal airspace opacities or suspicious nodules. No effusions. Upper Abdomen: Imaging into the upper abdomen shows no acute findings. Scattered calcifications in the liver and spleen compatible with old granulomatous disease. Musculoskeletal: No acute bony abnormality. IMPRESSION: Indistinctness in the region of the distal aortic arch/ aortic isthmus in the region of previously seen penetrating ulcer. There appears to be a small amount of low-density intramural hematoma adjacent to this portion of the aorta, which  does not extend below the proximal descending thoracic aorta. Seven similar appearance to prior study. Coronary artery disease. Small hiatal hernia. Electronically Signed   By: Charlett Nose M.D.   On: 12/01/2016 12:19    Labs:  Basic Metabolic Panel: BMP Latest Ref Rng & Units 12/03/2016 12/01/2016 11/28/2016  Glucose 65 - 99 mg/dL 87 91 409(W)  BUN 6 - 20 mg/dL 11(B) 14(N) 82(N)  Creatinine 0.61 - 1.24 mg/dL 5.62 1.30(Q) 6.57(Q)  Sodium 135 - 145 mmol/L 137 136 136  Potassium 3.5 - 5.1 mmol/L 3.9 4.3 4.2  Chloride 101 - 111 mmol/L 103 102 103  CO2 22 - 32 mmol/L 25 26 24   Calcium 8.9 - 10.3 mg/dL 4.6(N) 9.1 9.1    CBC: CBC Latest Ref Rng & Units 11/28/2016 11/26/2016 11/24/2016  WBC 4.0 - 10.5 K/uL 8.6 8.8 8.6  Hemoglobin 13.0 - 17.0 g/dL 62.9 52.8 41.3  Hematocrit 39.0 - 52.0 % 41.8 43.5 42.1  Platelets 150 - 400 K/uL 190 178 156    CBG: No results for input(s): GLUCAP in the last 168 hours.  Brief HPI:     Hospital Course: Jordan Mcintosh was admitted to rehab 11/27/2016 for inpatient therapies to consist of PT, ST and OT at least three hours five days a week. Past admission physiatrist, therapy team and rehab RN have worked together to provide customized collaborative inpatient rehab. Blood pressures have been monitored on qid basis for close monitoring due to SBP goal less than 140. His blood pressures have been well controlled and follow up CT chest is stable showing stability. Follow up CBC shows H/H is stable. Check of lytes past admission showed evidence of dehydration and he was treated with IVF briefly. He was encouraged to increase fluid intake and follow up labs shows improvement in renal  status. He has been educated on heart healthy/carb modified diet as well as importance of medication compliance in order to keep blood pressures under control. Mood has been stable and he has made steady progress during his rehab stay. He has progressed to modified independent level and will continue  to receive follow up outpatient PT, ST and OT at [facility] after discharge.   Rehab course: During patient's stay in rehab team conference was held to monitor patient's progress, set goals and discuss barriers to discharge. At admission, patient required supervision with mobility and min to supervision for ADL tasks. His cognitive status was intact with MoCA score 26/30. He demonstrated very mild  flaccid dysarthria and mild decline in STM.  He has had improvement in activity tolerance, balance, postural control, as well as ability to compensate for deficits. He is able to complete ADL tasks at modified independent level. He is ambulating in controlled environment at modified independent level without AD. BERG balance score  Is 51/56 at discharge. He requires intermittent supervision for ambulating in community setting. He is able to utilize memory strategies and utilize speech strategies at conversation level independently. He is able to direct care if assistance needed.     Disposition: 01-Home or Self Care  Diet: Heart healthy/Carb modified.   Special Instructions: 1. No driving or strenuous activity till cleared by MD.   Discharge Instructions    Ambulatory referral to Physical Medicine Rehab    Complete by:  As directed    1-2 weeks transitional care appt     Allergies as of 12/03/2016      Reactions   Codeine Nausea Only      Medication List    TAKE these medications   acetaminophen 325 MG tablet Commonly known as:  TYLENOL Take 1-2 tablets (325-650 mg total) by mouth every 6 (six) hours as needed for mild pain. What changed:  how much to take  reasons to take this   amLODipine 5 MG tablet Commonly known as:  NORVASC Take 1 tablet (5 mg total) by mouth daily. Notes to patient:  For blood pressure   aspirin 81 MG chewable tablet Chew 1 tablet (81 mg total) by mouth daily.   atorvastatin 40 MG tablet Commonly known as:  LIPITOR Take 1 tablet (40 mg total) by  mouth daily at 6 PM.   cloNIDine 0.2 mg/24hr patch Commonly known as:  CATAPRES - Dosed in mg/24 hr Place 1 patch (0.2 mg total) onto the skin once a week. On firdays What changed:  additional instructions Notes to patient:  Patch needs to be changed every Friday. Alternate sites weekly.    furosemide 40 MG tablet Commonly known as:  LASIX Take 1 tablet (40 mg total) by mouth daily.   losartan 100 MG tablet Commonly known as:  COZAAR Take 1.5 tablets (150 mg total) by mouth daily. What changed:  medication strength   metoprolol 100 MG tablet Commonly known as:  LOPRESSOR Take 1 tablet (100 mg total) by mouth 2 (two) times daily.   multivitamin with minerals Tabs tablet Take 1 tablet by mouth daily.   pantoprazole 40 MG tablet Commonly known as:  PROTONIX Take 1 tablet (40 mg total) by mouth 2 (two) times daily.   simethicone 80 MG chewable tablet Commonly known as:  MYLICON Chew 1 tablet (80 mg total) by mouth 4 (four) times daily. Notes to patient:  For bloating/gas. Available over the counter.    traMADol 50 MG tablet--Rx 30 pills Commonly  known as:  ULTRAM Take 1 tablet (50 mg total) by mouth every 12 (twelve) hours as needed for severe pain. What changed:  when to take this  reasons to take this      Follow-up Information    Marcello FennelPatel, Ankit Anil, MD Follow up.   Specialty:  Physical Medicine and Rehabilitation Contact information: [address] [place] [address] [phone]        Gordan PaymentGrisso, Greg A., MD. Nyra CapesGo on 12/29/2016.   Specialty:  Internal Medicine Why:  @ 2:20 PM  Contact information: [address] [place] [address] [phone]           Signed: Jacquelynn CreeLove, Anye Brose S 12/05/2016, 10:17 PM

## 2016-12-03 NOTE — Progress Notes (Signed)
Social Work Discharge Note  The overall goal for the admission was met for:   Discharge location: Yes - to wife's home initially  Length of Stay: Yes - 6 days  Discharge activity level: Yes - modified independent  Home/community participation: Yes  Services provided included: MD, RD, PT, OT, SLP, RN, TR, Pharmacy, Neuropsych and SW  Financial Services: Private Insurance: [employer]  Follow-up services arranged: Outpatient: PT/OT/ST, DME: tub transfer bench and Patient/Family request agency HH: Pt's wife lives near [facility], so set him up there for Outpt Rehab., DME: Wife already purchased tub transfer bench.  CSW reviewed with her how to install it properly.  Comments (or additional information): CSW spoke with pt and pt's wife to make sure they understood f/u therapies and appts.  CSW also gave pt the originals of his FMLA paperwork after faxing them to his HR department at [facility].  Pt and wife appreciative of CSW assistance and CIR care.  They will call with any questions.  Patient/Family verbalized understanding of follow-up arrangements: Yes  Individual responsible for coordination of the follow-up plan: pt with support from his wife  Confirmed correct DME delivered: Trey Sailors 12/03/2016    Shannan Slinker, Silvestre Mesi

## 2016-12-03 NOTE — Discharge Instructions (Signed)
Inpatient Rehab Discharge Instructions  Jordan Mcintosh Discharge date and time: 12/03/16   Activities/Precautions/ Functional Status: Activity: no lifting, driving, or strenuous exercise  till cleared by MD Diet: cardiac diet and diabetic diet Wound Care: none needed   Functional status:  ___ No restrictions     ___ Walk up steps independently ___ 24/7 supervision/assistance   ___ Walk up steps with assistance _X__ Intermittent supervision/assistance  ___ Bathe/dress independently ___ Walk with walker     ___ Bathe/dress with assistance ___ Walk Independently    _X__ Shower independently ___ Walk with assistance    ___ Shower with assistance _X__ No alcohol     ___ Return to work/school ________   COMMUNITY REFERRALS UPON DISCHARGE:   Outpatient: PT     OT      ST  Agency:  [facility]                            [address]                            [place], [place]  [address]              Phone:   [phone]  Appointment Date/Time: Tuesday, May 8th - Arrive at 12:30 PM to register;  12:45 PM - OT; 1:45 PM - PT; 2:45 PM - ST Medical Equipment/Items Ordered:  OT recommended tub transfer bench, but your wife has already purchased one.   GENERAL COMMUNITY RESOURCES FOR PATIENT/FAMILY: Support Groups:  Stroke [facility] Injury Support Group                              [address]                              [place], [place]  [address]                              [phone]. [phone]                              Meets the 2nd Tuesday of each month at 6 PM                               [facility] Stroke Support Group                              Meets the 2nd Thursday of each month from 3 - 4 PM (except June, July, and August)                              On [facility] in the day room of [facility] at [facility]                              For more information, call Caitlin Penven-Crew at [phone]  Special  Instructions:   STROKE/TIA DISCHARGE INSTRUCTIONS SMOKING Cigarette smoking nearly doubles your risk of  having a stroke & is the single most alterable risk factor  If you smoke or have smoked in the last 12 months, you are advised to quit smoking for your health.  Most of the excess cardiovascular risk related to smoking disappears within a year of stopping.  Ask you doctor about anti-smoking medications  [place] Quit Line: 1-800-QUIT NOW  Free Smoking Cessation Classes [phone]  CHOLESTEROL Know your levels; limit fat & cholesterol in your diet  Lipid Panel     Component Value Date/Time   CHOL 164 11/22/2016 0037   TRIG 117 11/22/2016 0037   HDL 34 (L) 11/22/2016 0037   CHOLHDL 4.8 11/22/2016 0037   VLDL 23 11/22/2016 0037   LDLCALC 107 (H) 11/22/2016 0037      Many patients benefit from treatment even if their cholesterol is at goal.  Goal: Total Cholesterol (CHOL) less than 160  Goal:  Triglycerides (TRIG) less than 150  Goal:  HDL greater than 40  Goal:  LDL (LDLCALC) less than 100   BLOOD PRESSURE American Stroke Association blood pressure target is less that 120/80 mm/Hg  Your discharge blood pressure is:  BP: 136/68  Monitor your blood pressure  Limit your salt and alcohol intake  Many individuals will require more than one medication for high blood pressure  DIABETES (A1c is a blood sugar average for last 3 months) Goal HGBA1c is under 7% (HBGA1c is blood sugar average for last 3 months)  Diabetes: Pre-diabetic    Lab Results  Component Value Date   HGBA1C 5.9 (H) 11/22/2016     Your HGBA1c can be lowered with medications, healthy diet, and exercise.  Check your blood sugar as directed by your physician  Call your physician if you experience unexplained or low blood sugars.  PHYSICAL ACTIVITY/REHABILITATION Goal is 30 minutes at least 4 days per week  Activity: No driving, Therapies: See above Return to work: to be decided on follow up  Activity  decreases your risk of heart attack and stroke and makes your heart stronger.  It helps control your weight and blood pressure; helps you relax and can improve your mood.  Participate in a regular exercise program.  Talk with your doctor about the best form of exercise for you (dancing, walking, swimming, cycling).  DIET/WEIGHT Goal is to maintain a healthy weight  Your discharge diet is: Diet heart healthy/carb modified Room service appropriate? Yes; Fluid consistency: Thin  liquids Your height is:  Height: 5\' 11"  (180.3 cm) Your current weight is: Weight: 97.8 kg (215 lb 9.8 oz) Your Body Mass Index (BMI) is:  BMI (Calculated): 28.8  Following the type of diet specifically designed for you will help prevent another stroke.  Your goal weight is: 179 lbs  Your goal Body Mass Index (BMI) is 19-24.  Healthy food habits can help reduce 3 risk factors for stroke:  High cholesterol, hypertension, and excess weight.  RESOURCES Stroke/Support Group:  Call [phone]   STROKE EDUCATION PROVIDED/REVIEWED AND GIVEN TO PATIENT Stroke warning signs and symptoms How to activate emergency medical system (call 911). Medications prescribed at discharge. Need for follow-up after discharge. Personal risk factors for stroke. Pneumonia vaccine given:  Flu vaccine given:  My questions have been answered, the writing is legible, and I understand these instructions.  I will adhere to these goals & educational materials that have been provided to me after my discharge from the hospital.     Carbohydrate Counting for Diabetes Mellitus, Adult Carbohydrate counting is a  method for keeping track of how many carbohydrates you eat. Eating carbohydrates naturally increases the amount of sugar (glucose) in the blood. Counting how many carbohydrates you eat helps keep your blood glucose within normal limits, which helps you manage your diabetes (diabetes mellitus). It is important to know how many carbohydrates you  can safely have in each meal. This is different for every person. A diet and nutrition specialist (registered dietitian) can help you make a meal plan and calculate how many carbohydrates you should have at each meal and snack. Carbohydrates are found in the following foods:  Grains, such as breads and cereals.  Dried beans and soy products.  Starchy vegetables, such as potatoes, peas, and corn.  Fruit and fruit juices.  Milk and yogurt.  Sweets and snack foods, such as cake, cookies, candy, chips, and soft drinks. How do I count carbohydrates? There are two ways to count carbohydrates in food. You can use either of the methods or a combination of both. Reading "Nutrition Facts" on packaged food  The "Nutrition Facts" list is included on the labels of almost all packaged foods and beverages in the [place] It includes:  The serving size.  Information about nutrients in each serving, including the grams (g) of carbohydrate per serving. To use the Nutrition Facts":  Decide how many servings you will have.  Multiply the number of servings by the number of carbohydrates per serving.  The resulting number is the total amount of carbohydrates that you will be having. Learning standard serving sizes of other foods  When you eat foods containing carbohydrates that are not packaged or do not include "Nutrition Facts" on the label, you need to measure the servings in order to count the amount of carbohydrates:  Measure the foods that you will eat with a food scale or measuring cup, if needed.  Decide how many standard-size servings you will eat.  Multiply the number of servings by 15. Most carbohydrate-rich foods have about 15 g of carbohydrates per serving.  For example, if you eat 8 oz (170 g) of strawberries, you will have eaten 2 servings and 30 g of carbohydrates (2 servings x 15 g = 30 g).  For foods that have more than one food mixed, such as soups and casseroles, you must count the  carbohydrates in each food that is included. The following list contains standard serving sizes of common carbohydrate-rich foods. Each of these servings has about 15 g of carbohydrates:   hamburger bun or  English muffin.   oz (15 mL) syrup.   oz (14 g) jelly.  1 slice of bread.  1 six-inch tortilla.  3 oz (85 g) cooked rice or pasta.  4 oz (113 g) cooked dried beans.  4 oz (113 g) starchy vegetable, such as peas, corn, or potatoes.  4 oz (113 g) hot cereal.  4 oz (113 g) mashed potatoes or  of a large baked potato.  4 oz (113 g) canned or frozen fruit.  4 oz (120 mL) fruit juice.  4-6 crackers.  6 chicken nuggets.  6 oz (170 g) unsweetened dry cereal.  6 oz (170 g) plain fat-free yogurt or yogurt sweetened with artificial sweeteners.  8 oz (240 mL) milk.  8 oz (170 g) fresh fruit or one small piece of fruit.  24 oz (680 g) popped popcorn. Example of carbohydrate counting Sample meal   3 oz (85 g) chicken breast.  6 oz (170 g) brown rice.  4 oz (113  g) corn.  8 oz (240 mL) milk.  8 oz (170 g) strawberries with sugar-free whipped topping. Carbohydrate calculation  1. Identify the foods that contain carbohydrates:  Rice.  Corn.  Milk.  Strawberries. 2. Calculate how many servings you have of each food:  2 servings rice.  1 serving corn.  1 serving milk.  1 serving strawberries. 3. Multiply each number of servings by 15 g:  2 servings rice x 15 g = 30 g.  1 serving corn x 15 g = 15 g.  1 serving milk x 15 g = 15 g.  1 serving strawberries x 15 g = 15 g. 4. Add together all of the amounts to find the total grams of carbohydrates eaten:  30 g + 15 g + 15 g + 15 g = 75 g of carbohydrates total. This information is not intended to replace advice given to you by your health care provider. Make sure you discuss any questions you have with your health care provider. Document Released: 07/19/2005 Document Revised: 02/06/2016 Document  Reviewed: 12/31/2015 Elsevier Interactive Patient Education  2017 [employer]   My questions have been answered and I understand these instructions. I will adhere to these goals and the provided educational materials after my discharge from the hospital.  Patient/Caregiver Signature _______________________________ Date __________  Clinician Signature _______________________________________ Date __________  Please bring this form and your medication list with you to all your follow-up doctor's appointments.

## 2016-12-06 ENCOUNTER — Telehealth: Payer: Self-pay | Admitting: *Deleted

## 2016-12-06 ENCOUNTER — Other Ambulatory Visit: Payer: Self-pay

## 2016-12-06 MED ORDER — LOSARTAN POTASSIUM 50 MG PO TABS: 150.0000 mg | ORAL_TABLET | Freq: Every day | ORAL | 0 refills | Status: DC

## 2016-12-06 NOTE — Telephone Encounter (Addendum)
Transitional Care call-1st attempt to reach unsuccessful @10 :12 12/06/16.  Left message to call back to office.  Wife Allene called back and completed call    1. Are you/is patient experiencing any problems since coming home? Are there any questions regarding any aspect of care?No 2. Are there any questions regarding medications administration/dosing? Are meds being taken as prescribed? Patient should review meds with caller to confirm losartan has not been obtained, disp is rejected by insurance. (100mg  tabs, dose 1.5 tab #45  Wife does not want to have to cut.  I have reordered as 50 mg tablets (3 per day= 150 mg daily) #90.  [facility] 3. Have there been any falls? No 4. Has Home Health been to the house and/or have they contacted you? If not, have you tried to contact them? Can we help you contact them? No, contact made and [facility]hab [facility]t tomorrow 12:20 5. Are bowels and bladder emptying properly? Are there any unexpected incontinence issues? If applicable, is patient following bowel/bladder programs?No 6. Any fevers, problems with breathing, unexpected pain? No 7. Are there any skin problems or new areas of breakdown? No 8. Has the patient/family member arranged specialty MD follow up (ie cardiology/neurology/renal/surgical/etc)?  Can we help arrange? Yes, appts given that were in Epic.  Appt to see Jordan Mcintosh Katzatel given today 9. Does the patient need any other services or support that we can help arrange? No 10. Are caregivers following through as expected in assisting the patient? Yes 11. Has the patient quit smoking, drinking alcohol, or using drugs as recommended? N/A  Appointment time, Friday 12/10/16 @ 11:40  Arrive by 11:20 to see Dr Jordan Mcintosh KatzPatel [address]

## 2016-12-06 NOTE — Telephone Encounter (Addendum)
Received approval for losartan 50 mg (3 tabs daily =150mg ) from [employer] effective dates 12/06/16-08/01/2038 reference # [id number].  Mr Jordan Mcintosh notified by personally identified voicemail.

## 2016-12-06 NOTE — Patient Outreach (Signed)
[facility]) Care Management  12/06/2016  Jordan Mcintosh [birth year] [id number]  EMMI: stroke Referral date: 12/06/16 Referral source:EMMI Stroke RED alert  Referral reason: Questions / problems with meds: YES Day # 1 Outreach attempt #1  Telephone call to patient regarding EMMI stroke red alert. Unable to reach patient. HIPAA compliant voice message left with call back phone number.    PLAN:  RNCM will attempt 2nd telephone outreach to patient within 1 week   George InaDavina Brittny Spangle RN,BSN,CCM [facility] Telephonic  [phone]

## 2016-12-07 ENCOUNTER — Other Ambulatory Visit: Payer: Self-pay

## 2016-12-07 ENCOUNTER — Other Ambulatory Visit: Payer: Self-pay | Admitting: Cardiothoracic Surgery

## 2016-12-07 DIAGNOSIS — I7100 Dissection of unspecified site of aorta: Secondary | ICD-10-CM

## 2016-12-07 NOTE — Patient Outreach (Signed)
[facility]) Care Management  12/07/2016  Jordan Mcintosh 10/20/1955 [id number]  EMMI: stroke Referral date: 12/06/16 Referral source:EMM Stroke red alert Referral reason: Questions / problems with meds: YES Day # 1  Telephone call to patient regarding EMMI stroke red alert. HIPAA verified with patient. Patient states the doctor wanted him to take 1 1/2 tablet of his blood pressure pill but his insurance company would not cover it.   Patient gave verbal consent to speak with his wife, Jordan Mcintosh regarding all of his personal health information. Wife states she just spoke with patients insurance company and they would not cover medication because they felt dose was to high. Wife states she has already spoke with the doctors office and they are working on correcting the situation.  Wife reports patient has a follow up appointment scheduled with his primary MD on 12/29/16 and a follow up with Dr. Allena KatzPatel on 12/10/16.  Wife states patient has all of his other medications and is taking them as prescribed Wife states patient has transportation to his appointments.  Wife states patient is getting outpatient therapy. Wife reports patient is still having right handed weakness. Wife states she and patient are currently going in to the physical therapy office and will have to end call. Wife verbalized agreement to follow up call.   ASSESSMENT:  Admit date: 11/27/2016 Discharge date: 12/03/2016  Discharge Diagnoses:  Principal Problem:   Left sided lacunar infarction [facility](HCC) Active Problems:   Embolic stroke (HCC)   Prediabetes   Benign essential HTN   AKI (acute kidney injury) (HCC)   History of noncompliance with medical treatment   Hypoalbuminemia due to protein-calorie malnutrition (HCC)   Hematoma   PLAN: RNCM will make follow up call with patient/ and or wife within 1 week.   George InaDavina Ashima Shrake RN,BSN,CCM [facility] Telephonic  [phone]

## 2016-12-07 NOTE — Patient Outreach (Signed)
[facility]) Care Management  12/07/2016  Jordan Mcintosh Jul 26, 1956 [id number]  EMMI: stroke Referral date: 12/06/16 Referral source:EMMI Stroke RED alert  Referral reason: Questions / problems with meds: YES Day # 1 Outreach attempt #2  Telephone call to patient regarding EMMI stroke red alert. Unable to reach patient. HIPAA compliant voice message left with call back phone number.    PLAN:  RNCM will attempt 3rd telephone outreach to patient within 1 week   George InaDavina Donnae Michels RN,BSN,CCM [facility] Telephonic  [phone]

## 2016-12-08 ENCOUNTER — Other Ambulatory Visit: Payer: Self-pay

## 2016-12-08 ENCOUNTER — Telehealth: Payer: Self-pay

## 2016-12-08 NOTE — Patient Outreach (Signed)
[facility]) Care Management  12/08/2016  Jordan Mcintosh 1956-06-19 [id number]  EMMI: stroke Referral date: 12/06/16 Referral source:EMMI Stroke RED alert  Referral reason: Questions / problems with meds: YES Day # 1  Telephone call to patient regarding EMMI stroke follow up. HIPAA verified with patients wife, Jordan Mcintosh.   Patients wife states she spoke with Dr. Sandria ManlyLove and patients losartan medication has been approved for 150mg  1 time per day. Wife states she will pick up the medication today.  Wife states patient complains of being very tired since he has been on his blood pressure medication.  Wife aware patient needs to be on medication for a while to adjust.  Wife states patient has a follow up appointment with the cardiologist on 12/15/16.  RNCM advised wife for patient to keep follow up appointments with doctor. RNCM discussed signs/ symptoms of stroke.  Advised to call 911 for stroke like symptoms.  RNCM advised wife for patient to take medications as prescribed.  RNCM advised patient to notify MD of any changes in condition prior to scheduled appointment. RNCM verified patient aware of 911 services for urgent/ emergent needs.  Wife states patient has no further needs at this time.   PLAN; RNCM will refer patient to care management assistant to close due to patient being assessed and  having no further needs.  Jordan InaDavina Elektra Wartman RN,BSN,CCM [facility] Telephonic  [phone]

## 2016-12-08 NOTE — Telephone Encounter (Signed)
Mrs. Jethro Polingorwood called yesterday, states is needing to do a peer to peer interview with PA Love in reguards to this patient for the prescription of Losartan and why it has been continuingly denied due to high dosage.  Please call Mrs Jethro Polingorwood for further information or call [phone] ext [phone] for the peer-to-peer review

## 2016-12-08 NOTE — Telephone Encounter (Signed)
Last note on 12-06-16 stated:   Received approval for losartan 50 mg (3 tabs daily =150mg ) from [employer] effective dates 12/06/16-08/01/2038 reference # [id number].  Mr Jordan Mcintosh notified by personally identified voicemail.

## 2016-12-08 NOTE — Telephone Encounter (Signed)
Discharge summary faxed to [employer] yesterday due to denial of medication. Talked to pharmacist this am for Peer to Peer review. Medication has been authorized--patient's wife informed.

## 2016-12-10 ENCOUNTER — Encounter: Payer: Self-pay | Admitting: Physical Medicine & Rehabilitation

## 2016-12-10 ENCOUNTER — Encounter
Payer: BLUE CROSS/BLUE SHIELD | Attending: Physical Medicine & Rehabilitation | Admitting: Physical Medicine & Rehabilitation

## 2016-12-10 VITALS — BP 106/71 | HR 53

## 2016-12-10 DIAGNOSIS — F1910 Other psychoactive substance abuse, uncomplicated: Secondary | ICD-10-CM | POA: Diagnosis not present

## 2016-12-10 DIAGNOSIS — I7100 Dissection of unspecified site of aorta: Secondary | ICD-10-CM

## 2016-12-10 DIAGNOSIS — I639 Cerebral infarction, unspecified: Secondary | ICD-10-CM

## 2016-12-10 DIAGNOSIS — N179 Acute kidney failure, unspecified: Secondary | ICD-10-CM | POA: Diagnosis not present

## 2016-12-10 DIAGNOSIS — I69854 Hemiplegia and hemiparesis following other cerebrovascular disease affecting left non-dominant side: Secondary | ICD-10-CM | POA: Diagnosis not present

## 2016-12-10 DIAGNOSIS — I6340 Cerebral infarction due to embolism of unspecified cerebral artery: Secondary | ICD-10-CM

## 2016-12-10 DIAGNOSIS — I10 Essential (primary) hypertension: Secondary | ICD-10-CM | POA: Insufficient documentation

## 2016-12-10 DIAGNOSIS — R7303 Prediabetes: Secondary | ICD-10-CM | POA: Diagnosis not present

## 2016-12-10 DIAGNOSIS — Z9119 Patient's noncompliance with other medical treatment and regimen: Secondary | ICD-10-CM | POA: Insufficient documentation

## 2016-12-10 DIAGNOSIS — Z91199 Patient's noncompliance with other medical treatment and regimen due to unspecified reason: Secondary | ICD-10-CM

## 2016-12-10 DIAGNOSIS — I69351 Hemiplegia and hemiparesis following cerebral infarction affecting right dominant side: Secondary | ICD-10-CM | POA: Diagnosis not present

## 2016-12-10 DIAGNOSIS — Z87891 Personal history of nicotine dependence: Secondary | ICD-10-CM | POA: Diagnosis not present

## 2016-12-10 DIAGNOSIS — I6381 Other cerebral infarction due to occlusion or stenosis of small artery: Secondary | ICD-10-CM

## 2016-12-10 NOTE — Progress Notes (Signed)
Subjective:    Patient ID: Jordan Mcintosh, male    DOB: 02/22/1956, 61 y.o.   MRN: [id number]    HPI 61 y.o. male with h/o of HTN, polysubstance abuse, medical noncompliance (no meds X 9 months) presents for transitional care management after receiving CIR for left posterior corona radiates, lentiform, and external capsule infarcts.   Admit date: 11/27/2016 Discharge date: 12/03/2016  Wife present, who provides significant portion of history. At discharge, he was instructed to follow up with CT Surg, he has an appointment next week.  BP is controlled. He has not checked his CBGs. He states he had adequate water intake. He has an appointment to see PCP.   DME: Pt had shower chair. Mobility: No assistive device. Therapies: 2/week  Pain Inventory Average Pain 2 Pain Right Now 2 My pain is dull  In the last 24 hours, has pain interfered with the following? General activity 1 Relation with others 2 Enjoyment of life 2 What TIME of day is your pain at its worst? evening Sleep (in general) Good  Pain is worse with: sitting Pain improves with: medication Relief from Meds: 7  Mobility walk without assistance how many minutes can you walk? 10 ability to climb steps?  yes do you drive?  no Do you have any goals in this area?  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  no  Physicians involved in your care Any changes since last visit?  no   Family History  Problem Relation Age of Onset  . Stroke Mother   . Stroke Sister   . Stroke Daughter    Social History   Social History  . Marital status: Married    Spouse name: N/A  . Number of children: N/A  . Years of education: N/A   Social History Main Topics  . Smoking status: Former Games developer  . Smokeless tobacco: Never Used     Comment: Smoked marijauna in the past but quit 9 years ago      . Alcohol use No  . Drug use: Yes    Types: Marijuana     Comment: Smoked marijauna in the past but quit 9 years ago; extensive gasoline sniffer as a teenager  . Sexual activity: Yes   Other Topics Concern  . None   Social History Narrative  . None   Past Surgical History:  Procedure Laterality Date  . INGUINAL HERNIA REPAIR    . UMBILICAL HERNIA REPAIR     Past Medical History:  Diagnosis Date  . Hypertension    BP 106/71   Pulse (!) 53   SpO2 97%   Opioid Risk Score:   Fall Risk Score:  `1  Depression screen PHQ 2/9  Depression screen PHQ 2/9 12/10/2016  Decreased Interest 3  Down, Depressed, Hopeless 2  PHQ - 2 Score 5  Altered sleeping 1  Tired, decreased energy 3  Change in appetite 0  Feeling bad or failure about yourself  0  Trouble concentrating 2  Moving slowly or fidgety/restless 2  Suicidal thoughts 0  PHQ-9 Score 13    Review of Systems  HENT: Negative.   Eyes: Negative.   Respiratory: Negative.   Cardiovascular: Negative.   Gastrointestinal: Negative.   Endocrine: Negative.   Genitourinary: Negative.   Musculoskeletal: Negative.   Skin: Negative.   Allergic/Immunologic: Negative.   Neurological: Positive for facial  asymmetry and weakness.  Psychiatric/Behavioral: Positive for confusion.       Objective:   Physical Exam Constitutional: He appears well-developed and well-nourished.  HENT: Normocephalic and atraumatic.  Eyes: EOMI. No discharge.  Cardiovascular: RRR.  No JVD. Respiratory: Effort normal and breath sounds normal.  GI: Soft. Bowel sounds are normal.  Musculoskeletal: He exhibits no edema or tenderness.  Neurological: He is alert and oriented.  Right facial weakness with dysarthria Able to follow commands.  Motor: RUE 4+/5 proximal to distal (improving), Decrease in [facility] (improving) RLE: 4+/5 proximal to distal  LUE/LLE: 5/5 proximal to distal  No increase in tone appreciated Skin: Skin is warm and dry.  Psychiatric:  He has a normal mood and affect.     Assessment & Plan:  61 y.o. male with h/o of HTN, polysubstance abuse, medical noncompliance (no meds X 9 months) presents for transitional care management after receiving CIR for left posterior corona radiates, lentiform, and external capsule infarcts.   1. 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 Intramural aortic Hematoma and penetrating ulcer: BP goals < 140.  Follow up with CT Surg, appointment next week with imagning  3. HTN  Controlled at present  Cont meds  Follow up with PCP  4. Prediabetes  Follow up with PCP for routine evaluation, pt not checking CBGs at present  5. AKI  Follow up with PCP for routine labs, pt states performed by PCP  Encouraged water intake, specifically   Meds reviewed Referrals reviewed - needs appointment with Neurology All questions answered

## 2016-12-15 ENCOUNTER — Ambulatory Visit
Admission: RE | Admit: 2016-12-15 | Discharge: 2016-12-15 | Disposition: A | Discharge status: Home / Self Care | Payer: BLUE CROSS/BLUE SHIELD | Source: Ambulatory Visit | Attending: Cardiothoracic Surgery | Admitting: Cardiothoracic Surgery

## 2016-12-15 ENCOUNTER — Ambulatory Visit (INDEPENDENT_AMBULATORY_CARE_PROVIDER_SITE_OTHER): Payer: BLUE CROSS/BLUE SHIELD | Admitting: Cardiothoracic Surgery

## 2016-12-15 VITALS — BP 112/56 | HR 57 | Resp 16 | Ht 71.0 in | Wt 204.6 lb

## 2016-12-15 DIAGNOSIS — I7100 Dissection of unspecified site of aorta: Secondary | ICD-10-CM

## 2016-12-15 DIAGNOSIS — I639 Cerebral infarction, unspecified: Secondary | ICD-10-CM

## 2016-12-15 NOTE — Progress Notes (Signed)
PCP is Gordan PaymentGrisso, Greg A., MD Referring Provider is Gordan PaymentGrisso, Greg A., MD  Chief Complaint  Patient presents with  . Follow-up    2 week f/u after hospital discharge with CXR    HPI: Patient returns for post hospital visit and blood pressure check. The patient was admitted with a hypertensive crisis and had a intramural hematoma of the proximal descending thoracic aorta. This was limited and did not progress into a dissection. He was treated with antihypertensive medication in the ICU and pain medication with resolution of symptoms. Repeat CT scan prior to discharge showed significant resolution of the IMH.  Since discharge the patient has had no recurrent abdominal or back pain. He has been compliant with his blood pressure medication and blood pressure control has been good. His deconditioning is improved and he is walking 20-30 minutes per day.  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 He was seen by neurology and has had outpatient physical therapy with significant improvement Past Medical History:  Diagnosis Date  . Hypertension     Past Surgical History:  Procedure Laterality Date  . INGUINAL HERNIA REPAIR    . UMBILICAL HERNIA REPAIR      Family History  Problem Relation Age of Onset  . Stroke Mother   . Stroke Sister   . Stroke Daughter     Social History Social History  Substance Use Topics  . Smoking status: Former Games developermoker  . Smokeless tobacco: Never Used     Comment: Smoked marijauna in the past but quit 9 years ago    . Alcohol use No    Current Outpatient Prescriptions  Medication Sig Dispense Refill  . acetaminophen (TYLENOL) 325 MG tablet Take 1-2 tablets (325-650 mg total) by mouth every 6  (six) hours as needed for mild pain.    Marland Kitchen. amLODipine (NORVASC) 5 MG tablet Take 1 tablet (5 mg total) by mouth daily. 30 tablet 0  . aspirin 81 MG chewable tablet Chew 1 tablet (81 mg total) by mouth daily. 100 tablet 0  . atorvastatin (LIPITOR) 40 MG tablet Take 1 tablet (40 mg total) by mouth daily at 6 PM. 30 tablet 0  . cloNIDine (CATAPRES - DOSED IN MG/24 HR) 0.2 mg/24hr patch Place 1 patch (0.2 mg total) onto the skin once a week. On firdays 4 patch 0  . furosemide (LASIX) 40 MG tablet Take 1 tablet (40 mg total) by mouth daily. 30 tablet 0  . losartan (COZAAR) 50 MG tablet Take 3 tablets (150 mg total) by mouth daily. 90 tablet 0  . metoprolol (LOPRESSOR) 100 MG tablet Take 1 tablet (100 mg total) by mouth 2 (two) times daily. 60 tablet 0  . Multiple Vitamin (MULTIVITAMIN WITH MINERALS) TABS tablet Take 1 tablet by mouth daily.    . pantoprazole (PROTONIX) 40 MG tablet Take 1 tablet (40 mg total) by mouth 2 (two) times daily. 60 tablet 0  . simethicone (MYLICON) 80 MG chewable tablet Chew 1 tablet (80 mg total) by mouth 4 (four) times daily. 30 tablet 0  . traMADol (ULTRAM) 50 MG tablet Take 1 tablet (50 mg total) by mouth every 12 (twelve) hours as needed for severe pain. 30  tablet 0   No current facility-administered medications for this visit.     Allergies  Allergen Reactions  . Codeine Nausea Only    Review of Systems  No more pain   right hand function significantly improving No ankle edema He is compliant with medications  BP (!) 112/56   Pulse (!) 57   Resp 16   Ht 5\' 11"  (1.803 m)   Wt 204 lb 9.6 oz (92.8 kg)   SpO2 99% Comment: RA  BMI 28.54 kg/m  Physical Exam      Exam    General- alert and comfortable   Lungs- clear without rales, wheezes   Cor- regular rate and rhythm, no murmur , gallop   Abdomen- soft, non-tender   Extremities - warm, non-tender, minimal edema   Neuro- oriented, appropriate, no focal weakness   Diagnostic Tests:  Chest x-ray  today is clear No left pleural effusion Stable mediastinal shadow  Impression:  Resolving intramural hematoma of descending thoracic aorta with blood pressure control  Plan:  Patient return for final CTA of thoracic aorta in 8 weeks. He knows he should not lift more than 30 pounds until then. He knows to continue his current medications for blood pressure including Catapres patch, losartan, metoprolol, amlodipine.  Mikey Bussing, MD [facility] [phone]

## 2016-12-30 ENCOUNTER — Other Ambulatory Visit: Payer: Self-pay | Admitting: Physical Medicine and Rehabilitation

## 2017-01-11 ENCOUNTER — Other Ambulatory Visit: Payer: Self-pay | Admitting: Cardiothoracic Surgery

## 2017-01-11 DIAGNOSIS — I7100 Dissection of unspecified site of aorta: Secondary | ICD-10-CM

## 2017-02-01 ENCOUNTER — Ambulatory Visit: Payer: BLUE CROSS/BLUE SHIELD | Admitting: Cardiothoracic Surgery

## 2017-02-08 ENCOUNTER — Ambulatory Visit
Admission: RE | Admit: 2017-02-08 | Discharge: 2017-02-08 | Disposition: A | Discharge status: Home / Self Care | Payer: BLUE CROSS/BLUE SHIELD | Source: Ambulatory Visit | Attending: Cardiothoracic Surgery | Admitting: Cardiothoracic Surgery

## 2017-02-08 ENCOUNTER — Ambulatory Visit (INDEPENDENT_AMBULATORY_CARE_PROVIDER_SITE_OTHER): Payer: BLUE CROSS/BLUE SHIELD | Admitting: Surgical

## 2017-02-08 VITALS — BP 122/76 | HR 56 | Resp 20 | Ht 71.0 in | Wt 201.0 lb

## 2017-02-08 DIAGNOSIS — I7100 Dissection of unspecified site of aorta: Secondary | ICD-10-CM

## 2017-02-08 MED ORDER — IOPAMIDOL (ISOVUE-370) INJECTION 76%
75.0000 mL | Freq: Once | INTRAVENOUS | Status: AC | PRN
  Administered 2017-02-08: 75 mL via INTRAVENOUS

## 2017-02-08 NOTE — Progress Notes (Signed)
[address]       [place] [address]             [phone]                  [facility] Medical Record #[id number] Date of Birth: 02/02/1956  Referring OZ:HYQMVH, Evlyn Courier., MD Primary Cardiology: Primary Care:Grisso, Evlyn Courier., MD  Chief Complaint:  Follow Up Visit    History of Present Illness:    The patient is seen on today's date in follow-up as scheduled. He reports that his blood pressure is doing well and he monitors it frequently. He has had a rare occasion where the blood pressure is actually below and had slight dizziness. He intends to discuss this with his primary care physician at his next visit. He denies chest pain or shortness of breath. He denies back pain. He is doing his every day a usual functions without difficulty. He has been going to physical therapy for rehabilitation of his stroke and they state his recovery is excellent and he is basically at his previous stroke baseline. 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    Zubrod Score: At the time of surgery this patient's most appropriate activity status/level should be described as: []     0    Normal activity, no symptoms []     1    Restricted in physical strenuous activity but ambulatory, able to do out light work []     2    Ambulatory and capable of self care, unable to do work activities, up and about                 >50 % of waking hours                                                                                   []     3    Only limited self care, in bed greater than 50% of waking hours []     4    Completely disabled, no self care, confined to bed or chair []     5    Moribund  History  Smoking Status  . Former Smoker  Smokeless Tobacco  . Never Used    Comment: Smoked marijauna in the past but quit 9 years ago         Allergies  Allergen Reactions  . Codeine Nausea Only     Current Outpatient Prescriptions  Medication Sig Dispense Refill  . acetaminophen (TYLENOL) 325 MG tablet Take 1-2 tablets (325-650 mg total) by mouth every 6 (six) hours as needed for mild pain.    Marland Kitchen amLODipine (NORVASC) 5 MG tablet Take 1 tablet (5 mg total) by mouth daily. 30 tablet 0  . aspirin 81 MG chewable tablet Chew 1 tablet (81 mg total) by mouth daily. 100 tablet 0  . atorvastatin (LIPITOR) 40 MG tablet Take 1 tablet (40 mg total) by mouth daily at 6 PM. 30 tablet 0  . cloNIDine (CATAPRES - DOSED IN MG/24 HR) 0.2 mg/24hr patch Place 1  patch (0.2 mg total) onto the skin once a week. On firdays 4 patch 0  . furosemide (LASIX) 40 MG tablet Take 1 tablet (40 mg total) by mouth daily. 30 tablet 0  . losartan (COZAAR) 50 MG tablet Take 3 tablets (150 mg total) by mouth daily. 90 tablet 0  . metoprolol (LOPRESSOR) 100 MG tablet Take 1 tablet (100 mg total) by mouth 2 (two) times daily. 60 tablet 0  . Multiple Vitamin (MULTIVITAMIN WITH MINERALS) TABS tablet Take 1 tablet by mouth daily.    . pantoprazole (PROTONIX) 40 MG tablet Take 1 tablet (40 mg total) by mouth 2 (two) times daily. 60 tablet 0  . simethicone (MYLICON) 80 MG chewable tablet Chew 1 tablet (80 mg total) by mouth 4 (four) times daily. 30 tablet 0  . traMADol (ULTRAM) 50 MG tablet Take 1 tablet (50 mg total) by mouth every 12 (twelve) hours as needed for severe pain. 30 tablet 0   No current facility-administered medications for this visit.        Physical Exam: BP 122/76   Pulse (!) 56   Resp 20   Ht 5\' 11"  (1.803 m)   Wt 201 lb (91.2 kg)   SpO2 99% Comment: RA  BMI 28.03 kg/m   General appearance: alert, cooperative and no distress Heart: regular rate and rhythm and no murmur Lungs: clear to auscultation bilaterally Abdomen: soft, non-tender; bowel sounds normal; no masses,  no organomegaly Extremities: extremities normal, atraumatic, no cyanosis or edema Wounds:  Diagnostic Studies & Laboratory data:          Recent Radiology Findings: Ct Angio Chest Aorta W/cm &/or Wo/cm  Result Date: 02/08/2017 CLINICAL DATA:  Intramural aortic hematoma. EXAM: CT ANGIOGRAPHY CHEST WITH CONTRAST TECHNIQUE: Multidetector CT imaging of the chest was performed using the standard protocol during bolus administration of intravenous contrast. Multiplanar CT image reconstructions and MIPs were obtained to evaluate the vascular anatomy. CONTRAST:  75 mL of Isovue 370 intravenously. COMPARISON:  CT scan of Dec 01, 2016. FINDINGS: Cardiovascular: There is no evidence of thoracic aortic aneurysm. Great vessels are widely patent without significant stenosis. 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 Mediastinum/Nodes: No enlarged mediastinal, hilar, or axillary lymph nodes. Thyroid gland, trachea, and esophagus demonstrate no significant findings. Lungs/Pleura: No pneumothorax or pleural effusion is noted. Stable 5 mm nodule is noted posteriorly in the right lung base best seen on image number 97 of series 4. 3 mm nodule is noted in lingular segment of left upper lobe best seen on image number 84 series 4. This is stable compared to prior exam. Upper Abdomen: Small sliding-type hiatal hernia is noted. Atherosclerosis of abdominal aorta is noted. Musculoskeletal: No chest wall abnormality. No acute or significant osseous findings. Review of the MIP images confirms the above findings. 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 Due to lack of intravenous contrast on prior exams, direct comparison to prior exams is limited. Continued CT follow-up in 6-12 months is recommended. 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 Small sliding-type hiatal hernia. Stable 5 mm nodule is noted in right lower lobe, as well as 3 mm nodule in lingular segment of left upper lobe. No follow-up needed  if patient is low-risk (and has no known or suspected primary neoplasm). Non-contrast chest CT can be considered in 12 months if patient is high-risk. This recommendation follows the consensus statement: Guidelines for Management of Incidental Pulmonary Nodules Detected on CT Images: From the Fleischner Society 2017; Radiology 2017; 284:228-243. Aortic Atherosclerosis (ICD10-I70.0). Electronically Signed   By: Lupita Raider, M.D.   On: 02/08/2017 11:47      I have independently reviewed the above radiology findings and reviewed findings  with the patient.  Recent Labs: Lab Results  Component Value Date   WBC 8.6 11/28/2016   HGB 14.0 11/28/2016   HCT 41.8 11/28/2016   PLT 190 11/28/2016   GLUCOSE 87 12/03/2016   CHOL 164 11/22/2016   TRIG 117 11/22/2016   HDL 34 (L) 11/22/2016   LDLCALC 107 (H) 11/22/2016   ALT 36 11/28/2016   AST 26 11/28/2016   NA 137 12/03/2016   K 3.9 12/03/2016   CL 103 12/03/2016   CREATININE 1.02 12/03/2016   BUN 21 (H) 12/03/2016   CO2 25 12/03/2016   TSH 0.634 11/22/2016   INR 0.99 11/21/2016   HGBA1C 5.9 (H) 11/22/2016      Assessment / Plan:  The CT scan was reviewed with Dr. Dorris Fetch and the size of the ulceration is difficult to interpret but appears to be fairly stable. The hematoma findings have resolved. His blood pressure is under good control and I will not adjust medicines at this time as his occasional readings of low blood pressure are relatively rare. He is also going to discuss this with his primary physician and bring his blood pressure records at that time to show trends. He is requesting to return to work full time with no restrictions and I have given him a note for this. He understands his overall limitations and as he is not required to do  significant exertion or lifting his job setting responsibility should be okay. I will also send a note to Dr. Zenaida Niece tried to reevaluate the CT scan and see if there is any additional procedures or testing required. We'll see him again in the office in 6 months.       GOLD,WAYNE E 02/08/2017 12:54 PM

## 2017-02-08 NOTE — Patient Instructions (Signed)
Return to work full-time with no restrictions

## 2017-02-09 ENCOUNTER — Encounter: Payer: Self-pay | Admitting: Physical Medicine & Rehabilitation

## 2017-02-09 ENCOUNTER — Ambulatory Visit: Payer: BLUE CROSS/BLUE SHIELD | Admitting: Cardiothoracic Surgery

## 2017-02-09 ENCOUNTER — Encounter
Payer: BLUE CROSS/BLUE SHIELD | Attending: Physical Medicine & Rehabilitation | Admitting: Physical Medicine & Rehabilitation

## 2017-02-09 ENCOUNTER — Other Ambulatory Visit: Payer: BLUE CROSS/BLUE SHIELD

## 2017-02-09 VITALS — BP 118/74 | HR 57 | Resp 14

## 2017-02-09 DIAGNOSIS — Z91199 Patient's noncompliance with other medical treatment and regimen due to unspecified reason: Secondary | ICD-10-CM

## 2017-02-09 DIAGNOSIS — I10 Essential (primary) hypertension: Secondary | ICD-10-CM | POA: Diagnosis not present

## 2017-02-09 DIAGNOSIS — I6930 Unspecified sequelae of cerebral infarction: Secondary | ICD-10-CM

## 2017-02-09 DIAGNOSIS — Z87891 Personal history of nicotine dependence: Secondary | ICD-10-CM | POA: Diagnosis not present

## 2017-02-09 DIAGNOSIS — Z9119 Patient's noncompliance with other medical treatment and regimen: Secondary | ICD-10-CM | POA: Insufficient documentation

## 2017-02-09 DIAGNOSIS — F1910 Other psychoactive substance abuse, uncomplicated: Secondary | ICD-10-CM | POA: Insufficient documentation

## 2017-02-09 DIAGNOSIS — R7303 Prediabetes: Secondary | ICD-10-CM | POA: Insufficient documentation

## 2017-02-09 DIAGNOSIS — I7100 Dissection of unspecified site of aorta: Secondary | ICD-10-CM | POA: Diagnosis not present

## 2017-02-09 DIAGNOSIS — N179 Acute kidney failure, unspecified: Secondary | ICD-10-CM | POA: Insufficient documentation

## 2017-02-09 DIAGNOSIS — I69854 Hemiplegia and hemiparesis following other cerebrovascular disease affecting left non-dominant side: Secondary | ICD-10-CM | POA: Diagnosis not present

## 2017-02-09 NOTE — Progress Notes (Signed)
Subjective:    Patient ID: Jordan Mcintosh, male    DOB: 21-Dec-1955, 61 y.o.   MRN: [id number]  HPI 61 y.o. male with h/o of HTN, polysubstance abuse, medical noncompliance (no meds X 9 months) presents for follow up for left posterior corona radiates, lentiform, and external capsule infarcts.   Last clinic visit 12/10/16.  Since last visit, he has completed therapies.  He continues HEP.  He has not followed up with Neurology.  He saw CTS.  BP is controled.   Pain Inventory Average Pain 2 Pain Right Now 0 My pain is dull  In the last 24 hours, has pain interfered with the following? General activity 2 Relation with others 2 Enjoyment of life 2 What TIME of day is your pain at its worst? evening Sleep (in general) Fair  Pain is worse with: sitting and some activites Pain improves with: rest and medication Relief from Meds: 5  Mobility walk without assistance how many minutes can you walk? unlimited ability to climb steps?  yes do you drive?  yes Do you have any goals in this area?  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  no  Neuro/Psych No problems in this area  Prior Studies Any changes since last visit?  no  Physicians involved in your care Any changes since last visit?  no   Family History  Problem Relation Age of Onset  . Stroke Mother   . Stroke Sister   . Stroke Daughter    Social History   Social History  . Marital status: Married    Spouse name: N/A  . Number of children: N/A  . Years of education: N/A   Social History Main Topics  . Smoking status: Former Games developermoker  . Smokeless tobacco: Never Used     Comment: Smoked marijauna in the past but quit 9 years ago    . Alcohol use No  . Drug use: Yes    Types: Marijuana     Comment: Smoked marijauna in the past but quit 9 years ago; extensive gasoline sniffer as a teenager  . Sexual activity: Yes   Other Topics Concern  . None    Social History Narrative  . None   Past Surgical History:  Procedure Laterality Date  . INGUINAL HERNIA REPAIR    . UMBILICAL HERNIA REPAIR     Past Medical History:  Diagnosis Date  . Hypertension    BP 118/74 (BP Location: Right Arm, Patient Position: Sitting, Cuff Size: Normal)   Pulse (!) 57   Resp 14   SpO2 95%   Opioid Risk Score:   Fall Risk Score:  `1  Depression screen PHQ 2/9  Depression screen PHQ 2/9 12/10/2016  Decreased Interest 3  Down, Depressed, Hopeless 2  PHQ - 2 Score 5  Altered sleeping 1  Tired, decreased energy 3  Change in appetite 0  Feeling bad or failure about yourself  0  Trouble concentrating 2  Moving slowly or fidgety/restless 2  Suicidal thoughts 0  PHQ-9 Score 13    Review of Systems  Constitutional: Negative.   HENT: Negative.   Eyes: Negative.   Respiratory: Negative.   Cardiovascular: Negative.   Gastrointestinal: Negative.   Endocrine: Negative.   Genitourinary: Negative.   Musculoskeletal: Positive for back pain.  Skin: Negative.   Allergic/Immunologic: Negative.   Neurological: Negative.   Hematological: Negative.   Psychiatric/Behavioral: Negative.  All other systems reviewed and are negative.      Objective:   Physical Exam Constitutional: He appears well-developed and well-nourished.  HENT: Normocephalic and atraumatic.  Eyes: EOMI. No discharge.  Cardiovascular: RRR.  No JVD. Respiratory: Effort normal and breath sounds normal.  GI: Soft. Bowel sounds are normal.  Musculoskeletal: He exhibits no edema or tenderness.  Neurological: He is alert and oriented.  Right facial weakness with dysarthria Able to follow commands.  Motor: RUE 5/5 proximal to distal (improving), FMC almost baseline RLE: 5/5 proximal to distal  LUE/LLE: 5/5 proximal to distal  No increase in tone appreciated Skin: Skin is warm and dry.  Psychiatric: He has a normal mood and affect.     Assessment & Plan:  61 y.o. male with h/o  of HTN, polysubstance abuse, medical noncompliance (no meds X 9 months) presents for follow up for left posterior corona radiates, lentiform, and external capsule infarcts.   1. Left posterior corona radiata, lentiform and ext capsule infarcts  Cont HEP  Cont meds  Follow up with Neurology - needs appointment  2. Intramural aortic Hematoma and penetrating ulcer: BP goals < 140.  Seen by CTS, cont to follow up   3. HTN  Controlled at present  Cont meds  Follow up with PCP

## 2017-02-23 ENCOUNTER — Other Ambulatory Visit: Payer: BLUE CROSS/BLUE SHIELD

## 2017-02-23 ENCOUNTER — Ambulatory Visit: Payer: BLUE CROSS/BLUE SHIELD | Admitting: Cardiothoracic Surgery

## 2017-02-24 ENCOUNTER — Encounter: Payer: Self-pay | Admitting: Neurology

## 2017-02-24 ENCOUNTER — Ambulatory Visit: Payer: BLUE CROSS/BLUE SHIELD | Admitting: Cardiothoracic Surgery

## 2017-02-28 ENCOUNTER — Ambulatory Visit (INDEPENDENT_AMBULATORY_CARE_PROVIDER_SITE_OTHER): Payer: BLUE CROSS/BLUE SHIELD | Admitting: Neurology

## 2017-02-28 ENCOUNTER — Encounter: Payer: Self-pay | Admitting: Neurology

## 2017-02-28 VITALS — BP 111/68 | HR 47 | Ht 71.0 in | Wt 194.0 lb

## 2017-02-28 DIAGNOSIS — I69351 Hemiplegia and hemiparesis following cerebral infarction affecting right dominant side: Secondary | ICD-10-CM | POA: Diagnosis not present

## 2017-02-28 DIAGNOSIS — F1190 Opioid use, unspecified, uncomplicated: Secondary | ICD-10-CM

## 2017-02-28 DIAGNOSIS — H53483 Generalized contraction of visual field, bilateral: Secondary | ICD-10-CM | POA: Diagnosis not present

## 2017-02-28 DIAGNOSIS — F422 Mixed obsessional thoughts and acts: Secondary | ICD-10-CM

## 2017-02-28 DIAGNOSIS — Q254 Congenital malformation of aorta unspecified: Secondary | ICD-10-CM

## 2017-02-28 DIAGNOSIS — E8809 Other disorders of plasma-protein metabolism, not elsewhere classified: Secondary | ICD-10-CM

## 2017-02-28 DIAGNOSIS — G4737 Central sleep apnea in conditions classified elsewhere: Secondary | ICD-10-CM | POA: Diagnosis not present

## 2017-02-28 DIAGNOSIS — I6350 Cerebral infarction due to unspecified occlusion or stenosis of unspecified cerebral artery: Secondary | ICD-10-CM

## 2017-02-28 DIAGNOSIS — I6381 Other cerebral infarction due to occlusion or stenosis of small artery: Secondary | ICD-10-CM

## 2017-02-28 DIAGNOSIS — E46 Unspecified protein-calorie malnutrition: Secondary | ICD-10-CM | POA: Diagnosis not present

## 2017-02-28 MED ORDER — SERTRALINE HCL 25 MG PO TABS: 25.0000 mg | ORAL_TABLET | Freq: Every day | ORAL | 5 refills | Status: AC

## 2017-02-28 NOTE — Addendum Note (Signed)
Addended by: Melvyn NovasHMEIER, Isreal Moline on: 02/28/2017 11:29 AM   Modules accepted: Orders

## 2017-02-28 NOTE — Patient Instructions (Signed)
Preventing Motor Vehicle Crashes, Adult Driving is important for many people, but it can be very dangerous without using safe driving practices. Every year, millions of people are injured and thousands die in motor vehicle accidents. Motor vehicle crashes are one of the leading causes of death and injury in the [place] You can be safe while driving and reduce your risk of an accident by taking certain simple actions. Why does preventing motor vehicle crashes matter?  A motor vehicle crash can have a huge impact on your life and your family. It can affect your physical and emotional health and your finances. After an accident, you may have to miss work, and your auto insurance rates may increase.  A motor vehicle crash can kill or injure anyone in the car or on the road, including innocent bystanders.  Many deaths and injuries could be avoided every year if more drivers took steps to prevent motor vehicle accidents. What changes can be made to prevent motor vehicle crashes?  Do not drive after drinking alcohol or using drugs. This includes some prescription and over-the-counter medicines that can make you drowsy or cause delayed reaction times. If you are taking prescription medicines, ask your health care provider if it is safe for you to drive.  Always wear a seat belt. The easiest way to prevent serious injuries or death from a car crash is to wear a seat belt every time you are in a car.  Before you start driving: ? Choose your radio station and leave the radio on that station until you arrive at your destination. ? Set your navigation system so you do not have to use it while driving.  Use a car seat or booster seat for young children. Make sure that these seats are installed correctly and are the right size for the child's age and weight.  Do not use a cell phone or any other digital device while driving. Do not text while driving.  Obey speed limits and other traffic laws at all times. Do  notspeed.  Pay close attention to road conditions. Slow down when there is rain, snow, or icy roads.  Do not eat or drink while driving.  Be alert and cautious of those around you while driving. Give other drivers plenty of space. What can happen if changes are not made? The personal and economic costs of motor vehicle accidents can be high. Be aware that:  Driving while under the influence of alcohol or drugs greatly increases your chance of having an accident. There could also be consequences such as fines or jail time.  Not using seat belts or the correct car seat for children greatly increases the risk of serious injury or death from an accident.  Distracted driving, such as eating, talking, or texting on the phone while driving, greatly increases your chance of having an accident.  What can I do to protect myself while driving or riding in a car?  Always wear your seat belt.  Stay aware of your surroundings. If you notice a dangerous driver, give that person plenty of space on the road or choose an alternate route, if possible.  Do not use a cell phone or any other digital device while driving. If you are riding in a car and the driver is using a phone, tell him or her to stop.  Do not drive after drinking alcohol, even after having just one drink. Do not ride in a car with a driver who has been drinking alcohol. Try   to stop others from driving after drinking. Where to find more information:  Centers for Disease Control and Prevention: www.cdc.gov/vitalsigns/motor-vehicle-safety.html  Occupational Health and Safety Administration: www.osha.gov/Publications/motor_vehicle_guide.pdf  Association of State and Territorial Health Officials: www.astho.org/Programs/Prevention/Injury-and-Violence-Prevention/Preventing-Motor-Vehicle-Injuries/Preventing-Motor-Vehicle-Injuries Summary  Motor vehicle crashes are one of the leading causes of death and injury in the [place]  Many crashes and  serious injuries can be avoided with simple changes in driving behavior.  To stay safe while driving, it is important to follow traffic laws and speed limits and use seat belts and car seats.  Never drive after drinking any alcohol or using drugs or medicines that make you drowsy or alter your reaction time.  Avoid distractions while driving, such as cell phones and food. This information is not intended to replace advice given to you by your health care provider. Make sure you discuss any questions you have with your health care provider. Document Released: 04/02/2016 Document Revised: 04/02/2016 Document Reviewed: 04/02/2016 Elsevier Interactive Patient Education  2018 [employer]  

## 2017-02-28 NOTE — Progress Notes (Addendum)
Provider:  Melvyn Novas, M D  Referring Provider: Gordan Payment., MD Primary Care Physician:  Gordan Payment., MD  Chief Complaint  Patient presents with  . New Patient (Initial Visit)    10/2016 stroke referred for stroke follow up. right root falling asleep, completed OP PT.    HPI:  Jordan Mcintosh is a 61 y.o. male is seen here as a referral by NP Patram ( from Dr. Shary Decamp 's office), seen here for ASAP stroke after care .   Jordan Mcintosh seen here in the presence of his wife, this patient suffered a stroke in April 2018. Onset of symptoms was April 22 when he presented with a right facial droop and right-sided body weakness, slurred speech and also abdominal pain and flank pain. He was found to have suffered a stroke as well as having an abnormally enlarged abdominal order. In addition an intramural aortic hematoma was noted. He was seen in hospital by neurology, by cardiothoracic surgery, and has already completed and neuro rehabilitation program-physical therapy and occupational therapy and speech therapy at the [facility] location. His referring nurse practitioner decreased in her last visit with him metoprolol back to 1/2 tab bid po. This equals 50 mg twice a day. His blood pressure is currently very good she states, she finished a note stating that a referral to a neurologist would be initiated upon request of physical therapy.  Diagnoses were: 1) bilateral atrial enlargement of the heart, 2) essential hypertension, 3)aortic hematoma intramural, 4)right-sided lacunar stroke ) GERD on protonix.   ROS : Jordan Mcintosh reports that his personality has changed, he sometimes gets more teary-eyed or choked up. His wife has commented on this as well. With the completion of his outpatient therapies this patient is still left with right-sided weakness and a visible right facial droop of the lower face only. His diagnosis has to be a left sided stroke with right-sided hemiparesis to occur. 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  Summary of his hospital stay:   Expand All Collapse All   [x] Hide copied text [] Hover for attribution information      Physical Medicine and Rehabilitation Consult Reason for Consult: Right sided weakness and slurred speech Referring Physician: Dr. Morton Peters   HPI: Jordan Mcintosh is a 61 y.o. right handed male with history of hypertension, tobacco and polysubstance abuse as well as poor medical compliance. Per chart review patient lives alone in [place]. Independent and still working. Elderly parents in the area of limited assistance. Presented 11/21/2016 with abdominal pain and back pain as well as right facial droop and right-sided weakness with slurred speech. UDS positive opiates. 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 He also had marked hypertension which required emergency intervention with Cardene drip. Cranial CT scan showed age indeterminate hypodensity involving the left lentiform nucleus. MRI of the brain showed acute lacunar infarct extending from the left posterior left corona radiata through to the left external capsule and posterior left lentiform. CT Angio head and neck showed no emergent large vessel occlusion. Echocardiogram with ejection fraction of 60% no wall motion abnormalities. Initial blood pressure control with labetalol drip. Cardiothoracic surgery follow-up for penetrating ulcer hematoma of descending thoracic aorta and no current plan for surgical intervention. Neurology has been consulted for workup of CVA. Physical and occupational therapy evaluations  completed with recommendations of physical medicine rehabilitation consult   Review of Systems  Constitutional: Negative for chills and fever.  HENT: Negative for hearing loss.     Eyes: Negative for blurred vision and double vision.  Respiratory: Positive for shortness of breath. Negative for cough.   Cardiovascular: Positive for palpitations and leg swelling. Negative for chest pain.  Gastrointestinal: Positive for constipation. Negative for nausea.  Genitourinary: Negative for dysuria, flank pain and hematuria.  Musculoskeletal: Positive for joint pain and myalgias.  Skin: Negative for rash.  Neurological: Positive for speech change, focal weakness and weakness. Negative for seizures.  All other systems reviewed and are negative.      Past Medical History:  Diagnosis Date  . Hypertension    History reviewed. No pertinent surgical history. History reviewed. No pertinent family history. Social History:  reports that he has quit smoking. He has never used smokeless tobacco. He reports that he uses drugs, including Marijuana. He reports that he does not drink alcohol. Allergies:      Allergies  Allergen Reactions  . Codeine Nausea Only   No prescriptions prior to admission.    Home: Home Living Family/patient expects to be discharged to:: Private residence Living Arrangements: Alone  Functional History: Functional Status:  Mobility:  ADL:  Cognition: Cognition Orientation Level: Oriented X4  Blood pressure (!) 146/82, pulse 77, temperature 98 F (36.7 C), temperature source Oral, resp. rate (!) 23, height 5\' 11"  (1.803 m), weight 90.7 kg (200 lb), SpO2 95 %. Physical Exam  HENT:  Facial droop  Eyes: EOM are normal. Left eye exhibits no discharge.  Neck: Normal range of motion. Neck supple. No thyromegaly present.  Cardiovascular: Normal rate and regular rhythm.   Respiratory: Effort normal and breath sounds normal. No respiratory distress.  GI: Soft. Bowel sounds are normal. He exhibits no distension.  Neurological: He is alert.  Speech is a bit dysarthric but intelligible. Follows full commands. Fair awareness of deficits. Right  central 7. RUE 3-/5 shoulder,biceps, triceps, 2+ HI. RLE: HF 4-/5 4/5 KE, 4-ADF/PF. LUE and LLE 5/5. Cognitively intact.   Skin: Skin is warm and dry.  Psychiatric: He has a normal mood and affect. His behavior is normal.    Lab Results Last 24 Hours  No results found for this or any previous visit (from the past 24 hour(s)).    Imaging Results (Last 48 hours)  Ct Angio Head W Or Wo Contrast  Result Date: 11/22/2016 CLINICAL DATA:  Stroke with right arm weakness and diminished right hand grip. EXAM: CT ANGIOGRAPHY HEAD AND NECK TECHNIQUE: Multidetector CT imaging of the head and neck was performed using the standard protocol during bolus administration of intravenous contrast. Multiplanar CT image reconstructions and MIPs were obtained to evaluate the vascular anatomy. Carotid stenosis measurements (when applicable) are obtained utilizing NASCET criteria, using the distal internal carotid diameter as the denominator. CONTRAST:  50 cc Isovue 370 intravenous COMPARISON:  Chest CTA.  Brain MRI from earlier today. FINDINGS: CTA NECK FINDINGS Aortic arch: 3 vessel branching pattern. There is a known penetrating ulcer near the isthmus with intramural hematoma. No evidence of progression since previous CTA. Thickening of the proximal left subclavian artery appears similar to the brachiocephalic and is likely atheromatous rather than hematoma. No involvement of the carotid origins is noted. Right carotid system: Atheromatous type wall thickening of the brachiocephalic artery. Moderate mixed density plaque at the common carotid bifurcation. ICA bulb stenosis measures up to 30%. No ulceration or beading. Left  carotid system: Calcified plaque at the common carotid origin. No evidence of intramural hematoma at the origin. Mild to moderate predominately calcified plaque at the common carotid bifurcation. No stenosis, ulceration, or beading. Vertebral arteries: Atheromatous type wall thickening of the proximal  subclavian artery. There is a kink in the left subclavian artery just before the vertebral origin approaching 50% stenosis on coronal reformats. Severe atheromatous type narrowing of the left vertebral origin. The codominant right vertebral artery is diffusely patent. No evidence of dissection. Skeleton: No acute or aggressive finding Other neck: No significant incidental finding. Upper chest: Arterial findings recorded above. No acute pulmonary finding. Review of the MIP images confirms the above findings CTA HEAD FINDINGS Anterior circulation: Symmetric carotid arteries. Diffuse calcification the carotid siphons without flow limiting stenosis. Hypoplastic left A1 segment. Fetal type PCA on the left. No large vessel occlusion. Diffuse atheromatous irregularity, most notably long segment of moderate narrowing in the distal right M1 segment, with advanced stenosis of the right MCA bifurcation, primarily affecting the lower branch. No irregularity or notable narrowing of the left ICA or M1 segment. The infarct pattern on previous brain MRI could be anterior choroidal or left lateral lenticulostriate. Negative for aneurysm. Posterior circulation: Symmetric vertebral arteries. Fetal type PCA on the left. Atheromatous irregularity and mild narrowing of the basilar artery. Mild to moderate atheromatous irregularity of the right more than left PCA. No reversible stenosis. Venous sinuses: Patent Anatomic variants: None other than described above Delayed phase: No abnormal intracranial enhancement Review of the MIP images confirms the above findings IMPRESSION: 1. No emergent large vessel occlusion. 2. 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 No evidence of left common carotid involvement to explain the infarct. 3. Moderate cervical carotid atherosclerosis without flow limiting stenosis. 4. Advanced atheromatous narrowing of the left vertebral artery origin. 5. Intracranial  atherosclerosis with moderate distal right M1 segment and advanced right MCA bifurcations stenosis. Electronically Signed   By: Marnee Spring M.D.   On: 11/22/2016 10:43   Ct Angio Neck W Or Wo Contrast  Result Date: 11/22/2016 CLINICAL DATA:  Stroke with right arm weakness and diminished right hand grip. EXAM: CT ANGIOGRAPHY HEAD AND NECK TECHNIQUE: Multidetector CT imaging of the head and neck was performed using the standard protocol during bolus administration of intravenous contrast. Multiplanar CT image reconstructions and MIPs were obtained to evaluate the vascular anatomy. Carotid stenosis measurements (when applicable) are obtained utilizing NASCET criteria, using the distal internal carotid diameter as the denominator. CONTRAST:  50 cc Isovue 370 intravenous COMPARISON:  Chest CTA.  Brain MRI from earlier today. FINDINGS: CTA NECK FINDINGS Aortic arch: 3 vessel branching pattern. There is a known penetrating ulcer near the isthmus with intramural hematoma. No evidence of progression since previous CTA. Thickening of the proximal left subclavian artery appears similar to the brachiocephalic and is likely atheromatous rather than hematoma. No involvement of the carotid origins is noted. Right carotid system: Atheromatous type wall thickening of the brachiocephalic artery. Moderate mixed density plaque at the common carotid bifurcation. ICA bulb stenosis measures up to 30%. No ulceration or beading. Left carotid system: Calcified plaque at the common carotid origin. No evidence of intramural hematoma at the origin. Mild to moderate predominately calcified plaque at the common carotid bifurcation. No stenosis, ulceration, or beading. Vertebral arteries: Atheromatous type wall thickening of the proximal subclavian artery. There is a kink in the left subclavian artery just before the vertebral origin approaching 50% stenosis on coronal reformats.  Severe atheromatous type narrowing of the left vertebral  origin. The codominant right vertebral artery is diffusely patent. No evidence of dissection. Skeleton: No acute or aggressive finding Other neck: No significant incidental finding. Upper chest: Arterial findings recorded above. No acute pulmonary finding. Review of the MIP images confirms the above findings CTA HEAD FINDINGS Anterior circulation: Symmetric carotid arteries. Diffuse calcification the carotid siphons without flow limiting stenosis. Hypoplastic left A1 segment. Fetal type PCA on the left. No large vessel occlusion. Diffuse atheromatous irregularity, most notably long segment of moderate narrowing in the distal right M1 segment, with advanced stenosis of the right MCA bifurcation, primarily affecting the lower branch. No irregularity or notable narrowing of the left ICA or M1 segment. The infarct pattern on previous brain MRI could be anterior choroidal or left lateral lenticulostriate. Negative for aneurysm. Posterior circulation: Symmetric vertebral arteries. Fetal type PCA on the left. Atheromatous irregularity and mild narrowing of the basilar artery. Mild to moderate atheromatous irregularity of the right more than left PCA. No reversible stenosis. Venous sinuses: Patent Anatomic variants: None other than described above Delayed phase: No abnormal intracranial enhancement Review of the MIP images confirms the above findings IMPRESSION: 1. No emergent large vessel occlusion. 2. 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 No evidence of left common carotid involvement to explain the infarct. 3. Moderate cervical carotid atherosclerosis without flow limiting stenosis. 4. Advanced atheromatous narrowing of the left vertebral artery origin. 5. Intracranial atherosclerosis with moderate distal right M1 segment and advanced right MCA bifurcations stenosis. Electronically Signed   By: Marnee Spring M.D.   On: 11/22/2016 10:43   Mr Laqueta Mcintosh ZO Contrast  Result  Date: 11/22/2016 CLINICAL DATA:  61 year old male who presented with abdomen and back pain, abnormal descending thoracic aorta, hypertension on labetalol IV. New onset right facial droop right extremity weakness and slurred speech. EXAM: MRI HEAD WITHOUT AND WITH CONTRAST TECHNIQUE: Multiplanar, multiecho pulse sequences of the brain and surrounding structures were obtained without and with intravenous contrast. CONTRAST:  20mL MULTIHANCE GADOBENATE DIMEGLUMINE 529 MG/ML IV SOLN COMPARISON:  Head CT without contrast 11/21/2016 FINDINGS: Brain: curvilinear restricted diffusion tracking from the posterior left corona radiata into the posterior limb of the left external capsule and posterior left lentiform nuclei corresponding to the indeterminate hypodensity on head CT yesterday. Associated T2 and FLAIR hyperintensity. No associated hemorrhage or mass effect. Superimposed chronic lacunar infarcts centered about the left caudothalamic groove. Several associated tiny chronic micro hemorrhages in the region. Patchy bilateral cerebral white matter T2 and FLAIR hyperintensity, mostly periventricular. Mild superimposed bilateral basal ganglia T2 heterogeneity appears in part due to perivascular spaces, no right side deep gray matter lacune identified, although there are multiple chronic brainstem lacunar infarcts, affecting the lower midbrain and pons greater on the right. Cerebellum within normal limits. No other chronic cerebral blood products. No cortical encephalomalacia identified. Cavum septum pellucidum, normal variant. No other restricted diffusion. No midline shift, mass effect, evidence of mass lesion, ventriculomegaly, extra-axial collection or acute intracranial hemorrhage. Cervicomedullary junction within normal limits. Anterior pituitary region T2 hyperintense lesion measuring 9 mm is probably a cyst without enhancement. No suprasellar mass or mass effect. No abnormal enhancement identified. Vascular: Major  intracranial vascular flow voids are preserved. Skull and upper cervical spine: Negative. Normal bone marrow signal. Sinuses/Orbits: Orbits soft tissues are within normal limits. Trace right maxillary sinus mucosal thickening, other paranasal sinuses are clear. Other: Mild right petrous apex air cell fluid appears inconsequential. Mastoids  are clear. Visible internal auditory structures appear normal. Negative scalp soft tissues. IMPRESSION: 1. CT finding yesterday corresponds to an acute lacunar infarct extending from the left posterior left corona radiata through to the left external capsule and posterior left lentiform. No associated hemorrhage or mass effect. 2. 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 3. 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 Electronically Signed   By: Odessa Fleming M.D.   On: 11/22/2016 09:33   Dg Chest Port 1 View  Result Date: 11/23/2016 CLINICAL DATA:  Shortness of breath and chest pain EXAM: PORTABLE CHEST 1 VIEW COMPARISON:  November 22, 2016 FINDINGS: There is mild atelectatic change in the bases, essentially stable on the right and less apparent on the left. Lungs elsewhere are clear. Heart is normal in size and contour. Pulmonary vascularity appears normal. No adenopathy. No bone lesions. IMPRESSION: Bibasilar atelectatic change, stable on the right and less apparent on the left compared to 1 day prior. No new opacity. Stable cardiac silhouette. Electronically Signed   By: Bretta Bang III M.D.   On: 11/23/2016 07:33   Portable Chest 1 View  Result Date: 11/22/2016 CLINICAL DATA:  Chest pain. EXAM: PORTABLE CHEST 1 VIEW COMPARISON:  One-view chest x-ray 11/21/2016 FINDINGS: The heart size is normal. There slight increase in linear airspace disease at the lung bases bilaterally. Lung volumes are low. There is no edema or effusion. No  other significant airspace consolidation is present. IMPRESSION: Increasing linear airspace disease at both lung bases likely reflects atelectasis in either the lower or middle lobe on the right and lower lobe or lingula on the left. Infection is considered less likely. Electronically Signed   By: Marin Roberts M.D.   On: 11/22/2016 08:06     Assessment/Plan: Diagnosis: Left lacunar infarct affecting the corona radiata/external capsule/left lentiform 1. Does the need for close, 24 hr/day medical supervision in concert with the patient's rehab needs make it unreasonable for this patient to be served in a less intensive setting? Yes 2. 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 Due to bladder management, bowel management, safety, skin/wound care, disease management, medication administration, pain management and patient education, does the patient require 24 hr/day rehab nursing? Yes 4. Does the patient require coordinated care of a physician, rehab nurse, PT (1-2 hrs/day, 5 days/week), OT (1-2 hrs/day, 5 days/week) and SLP (1-2 hrs/day, 5 days/week) to address physical and functional deficits in the context of the above medical diagnosis(es)? Yes Addressing deficits in the following areas: balance, endurance, locomotion, strength, transferring, bowel/bladder control, bathing, dressing, feeding, grooming, toileting, speech and psychosocial support 5. Can the patient actively participate in an intensive therapy program of at least 3 hrs of therapy per day at least 5 days per week? Yes 6. The potential for patient to make measurable gains while on inpatient rehab is excellent 7. Anticipated functional outcomes upon discharge from inpatient rehab are modified independent  with PT, modified independent with OT, modified independent with SLP. 8. Estimated rehab length of stay to reach the above functional goals is: 7-12 days 9. Does the patient have adequate  social supports and living environment to accommodate these discharge functional goals? Yes 10. Anticipated D/C setting: Home 11. Anticipated post D/C treatments: HH therapy and Outpatient therapy 12. Overall Rehab/Functional Prognosis: excellent  RECOMMENDATIONS: This patient's condition is appropriate for continued rehabilitative care in the following setting:  CIR Patient has agreed to participate in recommended program. Yes Note that insurance prior authorization may be required for reimbursement for recommended care.  Comment: Rehab Admissions Coordinator to follow up.  Thanks,  Ranelle OysterZachary T. Swartz, MD, [place] DomFAAPMR    Charlton AmorANGIULLI,DANIEL J., PA-C 11/24/2016      Cardiothoracic surgery clinic visit :" The CT scan was reviewed with Dr. Dorris FetchHendrickson and the size of the ulceration is difficult to interpret but appears to be fairly stable. The hematoma findings have resolved. His blood pressure is under good control and I will not adjust medicines at this time as his occasional readings of low blood pressure are relatively rare. He is also going to discuss this with his primary physician and bring his blood pressure records at that time to show trends. He is requesting to return to work full time with no restrictions and I have given him a note for this. He understands his overall limitations and as he is not required to do significant exertion or lifting his job setting responsibility should be okay. I will also send a note to Dr. Zenaida NieceVan tried to reevaluate the CT scan and see if there is any additional procedures or testing required. We'll see him again in the office in 6 months".    GOLD,WAYNE E 02/08/2017 12:54 PM   Social History   Social History  . Marital status: Married    Spouse name: N/A  . Number of children: N/A  . Years of education: N/A   Occupational History  . Not on file.   Social History Main Topics  . Smoking status: Former Games developermoker  . Smokeless tobacco: Never Used      Comment: Smoked marijauna in the past but quit 9 years ago    . Alcohol use No  . Drug use: Yes    Types: Marijuana     Comment: Smoked marijauna in the past but quit 9 years ago; extensive gasoline sniffer as a teenager  . Sexual activity: Yes   Other Topics Concern  . Not on file   Social History Narrative  . No narrative on file    Family History  Problem Relation Age of Onset  . Stroke Mother   . Stroke Sister   . Stroke Daughter     Past Medical History:  Diagnosis Date  . Hypertension   . Hypertension     Past Surgical History:  Procedure Laterality Date  . INGUINAL HERNIA REPAIR    . UMBILICAL HERNIA REPAIR      Current Outpatient Prescriptions  Medication Sig Dispense Refill  . acetaminophen (TYLENOL) 325 MG tablet Take 1-2 tablets (325-650 mg total) by mouth every 6 (six) hours as needed for mild pain.    Marland Kitchen. amLODipine (NORVASC) 5 MG tablet Take 1 tablet (5 mg total) by mouth daily. 30 tablet 0  . aspirin 81 MG chewable tablet Chew 1 tablet (81 mg total) by mouth daily. 100 tablet 0  . atorvastatin (LIPITOR) 40 MG tablet Take 1 tablet (40 mg total) by mouth daily at 6 PM. 30 tablet 0  . cloNIDine (CATAPRES - DOSED IN MG/24 HR) 0.2 mg/24hr patch Place 1 patch (0.2 mg total) onto the skin once a week. On firdays 4 patch 0  . furosemide (LASIX) 40 MG tablet Take 1 tablet (40 mg total) by mouth daily. 30 tablet 0  . losartan (COZAAR) 50 MG tablet Take 3 tablets (150 mg total) by mouth daily. 90 tablet 0  . metoprolol (LOPRESSOR) 100 MG tablet  Take 1 tablet (100 mg total) by mouth 2 (two) times daily. 60 tablet 0  . Multiple Vitamin (MULTIVITAMIN WITH MINERALS) TABS tablet Take 1 tablet by mouth daily.    . pantoprazole (PROTONIX) 40 MG tablet Take 1 tablet (40 mg total) by mouth 2 (two) times daily. 60 tablet 0  . simethicone (MYLICON) 80 MG chewable tablet Chew 1 tablet (80 mg total) by mouth 4 (four) times daily. 30 tablet 0  . traMADol (ULTRAM) 50 MG tablet Take 1  tablet (50 mg total) by mouth every 12 (twelve) hours as needed for severe pain. 30 tablet 0   No current facility-administered medications for this visit.     Allergies as of 02/28/2017 - Review Complete 02/28/2017  Allergen Reaction Noted  . Codeine Nausea Only 11/21/2016    Vitals: BP 111/68   Pulse (!) 47   Ht 5\' 11"  (1.803 m)   Wt 194 lb (88 kg)   BMI 27.06 kg/m  Last Weight:  Wt Readings from Last 1 Encounters:  02/28/17 194 lb (88 kg)   Last Height:   Ht Readings from Last 1 Encounters:  02/28/17 5\' 11"  (1.803 m)    Physical exam:  General: The patient is awake, alert and appears not in acute distress. The patient is well groomed. Head: Normocephalic, atraumatic. Neck is supple. Mallampati4, neck circumference:16 Cardiovascular:  Regular rate and rhythm, without  murmurs or carotid bruit, and without distended neck veins. Respiratory: Lungs are clear to auscultation. Skin:  Without evidence of edema, or rash- his right hand skin is "shiny"  Trunk: BMI is  elevated and patient  has normal posture.  Neurologic exam : The patient is awake and alert, oriented to place and time.  Memory subjective  described as intact. attention span & concentration ability are hard to judge- he is deaf.   Speech is fluent with dysarthria, dysphonia and mild aphasia. Mood and affect are depressed. He appears slowed.  Cranial nerves: Pupils are equal and briskly reactive to light.  Funduscopic exam without evidence of pallor or edema. Extraocular movements  in vertical and horizontal planes intact and without nystagmus.  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   Facial sensation intact to fine touch. Facial motor strength is : facial droop on the left - tongue moves midline.  Uvula deviated, soft palate is lower on his right side.   Tongue protrusion into either cheek is normal. Shoulder shrug is normal.   Motor exam:  He has  weakness in the right wrists and hand, pronation in the right arm. He reports problems to lift his right foot of the gas.   Sensory:  Fine touch, pinprick and vibration were tested in all extremities. Coordination: Rapid alternating movements in the fingers/hands were slowed on the right - the dominant hand. Finger-to-nose maneuver normal without evidence of ataxia, dysmetria , only mild tremor on the left. Gait and station: Patient walks without assistive device.  Stance is stable and normal.  Deep tendon reflexes: in the upper and lower extremities are symmetric and intact. There was only a trace stronger reflex in the right patella than left. Babinski maneuver response is up-going on the left   Assessment:  After physical and neurologic examination, review of laboratory studies, imaging, neurophysiology testing and pre-existing records, assessment is that of :   This right-handed Caucasian 61 year old gentleman had multiple risk factors for stroke including a borderline diabetes, previously poorly controlled hypertension, smoking,  and he did not have a regular exercise program or diet. He quit smoking- marihuana. This may explain his ABULIA. He quit drinking.     His diet has changed and he has lost weight, his diabetes is better controlled, his hypertension has been not just well controlled but he became hypotensive and is now on a lower blood pressure medication dose. He addresses all the risk factors for stroke.  He snores, loud and this has not stopped with weight loss, he has apnea.     I would have liked for him to see one of my colleagues, Dr. Roda Shutters or Dr. Pearlean Brownie as vascular neurologist in follow-up, but I can range weight in the future. What I would like to address today is a follow-up with optometrist or ophthalmologist to obtain a formal visual field, and continue with a routine exercise. I recommend a [employer] member ship, swimming rowing, eliptical and stationary bike.  I will order a  sleep study. I will ask him to keep limiting weights he lifts at work.     Porfirio Mylar Rosali Augello MD 02/28/2017

## 2017-03-10 ENCOUNTER — Telehealth: Payer: Self-pay

## 2017-03-10 NOTE — Telephone Encounter (Signed)
Called patient on 03/10/17 to schedule sleep study. Pt refused to schedule at this time.  Pt stated that he doesn't think he needs to have the study.-M.Freida BusmanAllen

## 2017-03-17 ENCOUNTER — Other Ambulatory Visit: Payer: Self-pay | Admitting: Physical Medicine & Rehabilitation

## 2017-07-29 ENCOUNTER — Other Ambulatory Visit: Payer: Self-pay | Admitting: Cardiothoracic Surgery

## 2017-07-29 DIAGNOSIS — I712 Thoracic aortic aneurysm, without rupture, unspecified: Secondary | ICD-10-CM

## 2017-08-11 ENCOUNTER — Encounter
Payer: BLUE CROSS/BLUE SHIELD | Attending: Physical Medicine & Rehabilitation | Admitting: Physical Medicine & Rehabilitation

## 2017-08-11 ENCOUNTER — Encounter: Payer: Self-pay | Admitting: Physical Medicine & Rehabilitation

## 2017-08-11 VITALS — BP 153/81 | HR 83

## 2017-08-11 DIAGNOSIS — I639 Cerebral infarction, unspecified: Secondary | ICD-10-CM | POA: Diagnosis not present

## 2017-08-11 DIAGNOSIS — I69322 Dysarthria following cerebral infarction: Secondary | ICD-10-CM | POA: Diagnosis not present

## 2017-08-11 DIAGNOSIS — Q254 Congenital malformation of aorta unspecified: Secondary | ICD-10-CM

## 2017-08-11 DIAGNOSIS — Z823 Family history of stroke: Secondary | ICD-10-CM | POA: Insufficient documentation

## 2017-08-11 DIAGNOSIS — Z87891 Personal history of nicotine dependence: Secondary | ICD-10-CM | POA: Insufficient documentation

## 2017-08-11 DIAGNOSIS — I69392 Facial weakness following cerebral infarction: Secondary | ICD-10-CM | POA: Diagnosis present

## 2017-08-11 DIAGNOSIS — I6381 Other cerebral infarction due to occlusion or stenosis of small artery: Secondary | ICD-10-CM

## 2017-08-11 DIAGNOSIS — I7100 Dissection of unspecified site of aorta: Secondary | ICD-10-CM

## 2017-08-11 DIAGNOSIS — Z9114 Patient's other noncompliance with medication regimen: Secondary | ICD-10-CM | POA: Diagnosis not present

## 2017-08-11 DIAGNOSIS — I10 Essential (primary) hypertension: Secondary | ICD-10-CM

## 2017-08-11 DIAGNOSIS — I719 Aortic aneurysm of unspecified site, without rupture: Secondary | ICD-10-CM | POA: Insufficient documentation

## 2017-08-11 DIAGNOSIS — I6330 Cerebral infarction due to thrombosis of unspecified cerebral artery: Secondary | ICD-10-CM

## 2017-08-11 NOTE — Progress Notes (Signed)
Subjective:    Patient ID: Jordan Mcintosh, male    DOB: 19-Jul-1956, 62 y.o.   MRN: [id number]  HPI 62 y.o. male with h/o of HTN, polysubstance abuse, medical noncompliance (no meds X 9 months) presents for follow up for left posterior corona radiates, lentiform, and external capsule infarcts.   Last clinic visit 02/09/17.  Since that time, pt states he is not doing HEP.  He saw Neurology. His BP remains elevated, wife states he forgot his BP meds. He is following up with CTS. He has returned to work full time without issue. Denies falls.   Pain Inventory Average Pain 3 Pain Right Now 0 My pain is dull  In the last 24 hours, has pain interfered with the following? General activity 0 Relation with others 0 Enjoyment of life 0 What TIME of day is your pain at its worst? evening Sleep (in general) Fair  Pain is worse with: sitting and some activites Pain improves with: rest and medication Relief from Meds: 5  Mobility walk without assistance how many minutes can you walk? unlimited ability to climb steps?  yes do you drive?  yes Do you have any goals in this area?  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  no  Neuro/Psych No problems in this area  Prior Studies Any changes since last visit?  no  Physicians involved in your care Any changes since last visit?  no   Family History  Problem Relation Age of Onset  . Stroke Mother   . Stroke Sister   . Stroke Daughter    Social History   Socioeconomic History  . Marital status: Married    Spouse name: None  . Number of children: None  . Years of education: None  . Highest education level: None  Social Needs  . Financial resource strain: None  . Food insecurity - worry: None  . Food insecurity - inability: None  . Transportation needs - medical: None  . Transportation needs - non-medical: None  Occupational History  . None  Tobacco Use  .  Smoking status: Former Games developermoker  . Smokeless tobacco: Never Used  . Tobacco comment: Smoked marijauna in the past but quit 9 years ago    Substance and Sexual Activity  . Alcohol use: No  . Drug use: Yes    Types: Marijuana    Comment: Smoked marijauna in the past but quit 9 years ago; extensive gasoline sniffer as a teenager  . Sexual activity: Yes  Other Topics Concern  . None  Social History Narrative  . None   Past Surgical History:  Procedure Laterality Date  . INGUINAL HERNIA REPAIR    . UMBILICAL HERNIA REPAIR     Past Medical History:  Diagnosis Date  . Hypertension   . Hypertension    BP (!) 153/81   Pulse 83   SpO2 98%   Opioid Risk Score:   Fall Risk Score:  `1  Depression screen PHQ 2/9  Depression screen PHQ 2/9 12/10/2016  Decreased Interest 3  Down, Depressed, Hopeless 2  PHQ - 2 Score 5  Altered sleeping 1  Tired, decreased energy 3  Change in appetite 0  Feeling bad or failure about yourself  0  Trouble concentrating 2  Moving slowly or fidgety/restless 2  Suicidal thoughts 0  PHQ-9 Score 13    Review of Systems  Constitutional: Positive for unexpected weight change.  HENT: Negative.   Eyes: Negative.   Respiratory: Negative.   Cardiovascular: Negative.   Gastrointestinal: Negative.   Endocrine: Negative.   Genitourinary: Negative.   Musculoskeletal: Negative.   Skin: Negative.   Allergic/Immunologic: Negative.   Neurological: Negative.   Hematological: Negative.   Psychiatric/Behavioral: Negative.   All other systems reviewed and are negative.      Objective:   Physical Exam Constitutional: He appears well-developed and well-nourished.  HENT: Normocephalic and atraumatic.  Eyes: EOMI. No discharge.  Cardiovascular: RRR.  No JVD. Respiratory: Effort normal and breath sounds normal.  GI: Soft. Bowel sounds are normal.  Musculoskeletal: He exhibits no edema or tenderness.  Neurological: He is alert and oriented.  Right facial  weakness with mild dysarthria Able to follow commands.  Motor: RUE 5/5 proximal to distal RLE: 5/5 proximal to distal  LUE/LLE: 5/5 proximal to distal  No increase in tone appreciated Skin: Skin is warm and dry.  Psychiatric: He has a normal mood and affect.     Assessment & Plan:  62 y.o. male with h/o of HTN, polysubstance abuse, medical noncompliance (no meds X 9 months) presents for follow up for left posterior corona radiates, lentiform, and external capsule infarcts.   1. Left posterior corona radiata, lentiform and ext capsule infarcts  Cont HEP, encouraged compliance  Cont meds  Cont follow up with Neurology   2. Intramural aortic Hematoma and penetrating ulcer: BP goals < 140.  Seen by CTS, cont to follow up  Elevated today, however, wife states pt forgot meds this AM.   3. HTN  Follow up with PCP  Cont meds

## 2017-08-17 ENCOUNTER — Ambulatory Visit: Payer: BLUE CROSS/BLUE SHIELD | Admitting: Cardiothoracic Surgery

## 2017-08-24 ENCOUNTER — Ambulatory Visit
Admission: RE | Admit: 2017-08-24 | Discharge: 2017-08-24 | Disposition: A | Discharge status: Home / Self Care | Payer: BLUE CROSS/BLUE SHIELD | Source: Ambulatory Visit | Attending: Cardiothoracic Surgery | Admitting: Cardiothoracic Surgery

## 2017-08-24 ENCOUNTER — Encounter: Payer: Self-pay | Admitting: Cardiothoracic Surgery

## 2017-08-24 ENCOUNTER — Other Ambulatory Visit: Payer: Self-pay

## 2017-08-24 ENCOUNTER — Ambulatory Visit (INDEPENDENT_AMBULATORY_CARE_PROVIDER_SITE_OTHER): Payer: BLUE CROSS/BLUE SHIELD | Admitting: Cardiothoracic Surgery

## 2017-08-24 VITALS — BP 139/77 | HR 67 | Resp 18 | Ht 71.0 in | Wt 215.4 lb

## 2017-08-24 DIAGNOSIS — I719 Aortic aneurysm of unspecified site, without rupture: Secondary | ICD-10-CM

## 2017-08-24 DIAGNOSIS — I712 Thoracic aortic aneurysm, without rupture, unspecified: Secondary | ICD-10-CM

## 2017-08-24 MED ORDER — IOPAMIDOL (ISOVUE-370) INJECTION 76%
75.0000 mL | Freq: Once | INTRAVENOUS | Status: AC | PRN
  Administered 2017-08-24: 75 mL via INTRAVENOUS

## 2017-08-24 NOTE — Progress Notes (Signed)
PCP is Gordan PaymentGrisso, Greg A., MD Referring Provider is Gordan PaymentGrisso, Greg A., MD  Chief Complaint  Patient presents with  . Thoracic Aortic Aneurysm    f/u with CTA Chest    HPI: Patient returns with a CTA to review a small penetrating ulcer of the lesser curve of the aortic arch distal to the left subclavian. This has been managed conservatively with blood pressure control, Lipitor, aspirin. The patient is a nonsmoker. The patient's penetrating ulcer is asymptomatic. Today the scan shows significant improvement with reduction in size in the depth and width of the ulceration. It currently measures 10 by 8 mm. Otherwise the diameter of the ascending aorta and aortic arch are normal. We'll continue to follow the penetrating ulcer but increase the interval between scans out to 9 months.  Patient is encouraged to perform 20 minutes of aerobic activity-walking 4 days a week and to avoid fast food.  Past Medical History:  Diagnosis Date  . Hypertension   . Hypertension     Past Surgical History:  Procedure Laterality Date  . INGUINAL HERNIA REPAIR    . UMBILICAL HERNIA REPAIR      Family History  Problem Relation Age of Onset  . Stroke Mother   . Stroke Sister   . Stroke Daughter     Social History Social History   Tobacco Use  . Smoking status: Former Games developermoker  . Smokeless tobacco: Never Used  . Tobacco comment: Smoked marijauna in the past but quit 9 years ago    Substance Use Topics  . Alcohol use: No  . Drug use: Yes    Types: Marijuana    Comment: Smoked marijauna in the past but quit 9 years ago; extensive gasoline sniffer as a teenager    Current Outpatient Medications  Medication Sig Dispense Refill  . acetaminophen (TYLENOL) 325 MG tablet Take 1-2 tablets (325-650 mg total) by mouth every 6 (six) hours as needed for mild pain.    Marland Kitchen. amLODipine (NORVASC) 5 MG tablet Take 1 tablet (5 mg total) by mouth daily. 30 tablet 0  . aspirin 81 MG chewable tablet Chew 1 tablet (81 mg  total) by mouth daily. 100 tablet 0  . atorvastatin (LIPITOR) 40 MG tablet Take 1 tablet (40 mg total) by mouth daily at 6 PM. 30 tablet 0  . cloNIDine (CATAPRES - DOSED IN MG/24 HR) 0.2 mg/24hr patch Place 1 patch (0.2 mg total) onto the skin once a week. On firdays 4 patch 0  . furosemide (LASIX) 40 MG tablet Take 1 tablet (40 mg total) by mouth daily. 30 tablet 0  . losartan (COZAAR) 50 MG tablet TAKE 3 TABLETS BY MOUTH ONCE DAILY 90 tablet 3  . metoprolol (LOPRESSOR) 100 MG tablet Take 1 tablet (100 mg total) by mouth 2 (two) times daily. 60 tablet 0  . Multiple Vitamin (MULTIVITAMIN WITH MINERALS) TABS tablet Take 1 tablet by mouth daily.    . pantoprazole (PROTONIX) 40 MG tablet Take 1 tablet (40 mg total) by mouth 2 (two) times daily. 60 tablet 0  . simethicone (MYLICON) 80 MG chewable tablet Chew 1 tablet (80 mg total) by mouth 4 (four) times daily. 30 tablet 0  . traMADol (ULTRAM) 50 MG tablet Take 1 tablet (50 mg total) by mouth every 12 (twelve) hours as needed for severe pain. 30 tablet 0  . PROAIR HFA 108 (90 Base) MCG/ACT inhaler   0  . sertraline (ZOLOFT) 25 MG tablet Take 1 tablet (25 mg total) by mouth  daily. (Patient not taking: Reported on 08/24/2017) 30 tablet 5   No current facility-administered medications for this visit.     Allergies  Allergen Reactions  . Codeine Nausea Only    Review of Systems   Prior history of stroke, deficits almost resolved Feeling depressed for which his primary care prescribed Zoloft Gained approximately 5-8 pounds in the last 6 months No chest pain or interscapular back pain No palpitations  BP 139/77 (BP Location: Left Arm, Patient Position: Sitting, Cuff Size: Normal)   Pulse 67   Resp 18   Ht 5\' 11"  (1.803 m)   Wt 215 lb 6.4 oz (97.7 kg)   SpO2 98% Comment: RA  BMI 30.04 kg/m  Physical Exam      Exam    General- alert and comfortable    Neck- no JVD, no cervical adenopathy palpable, no carotid bruit   Lungs- clear without  rales, wheezes   Cor- regular rate and rhythm, no murmur , gallop   Abdomen- soft, non-tender   Extremities - warm, non-tender, minimal edema   Neuro- oriented, appropriate, no focal weakness   Diagnostic Tests: CTA images personally reviewed showing clear reduction in size of the aortic arch small  penetrating ulcer  Impression: Continue medical therapy and surveillance scans Importance of blood pressure control, heart healthy diet, heart healthy exercise lifestyle emphasized the patient Plan: Return in 9 months with CTA of the thoracic aorta  Mikey Bussing, MD [facility] [phone]

## 2018-02-01 ENCOUNTER — Encounter: Payer: Self-pay | Admitting: Physical Medicine & Rehabilitation

## 2018-02-01 ENCOUNTER — Encounter
Payer: BLUE CROSS/BLUE SHIELD | Attending: Physical Medicine & Rehabilitation | Admitting: Physical Medicine & Rehabilitation

## 2018-02-01 VITALS — BP 115/74 | HR 65 | Resp 14 | Ht 70.0 in | Wt 213.0 lb

## 2018-02-01 DIAGNOSIS — I6381 Other cerebral infarction due to occlusion or stenosis of small artery: Secondary | ICD-10-CM | POA: Diagnosis not present

## 2018-02-01 DIAGNOSIS — I6389 Other cerebral infarction: Secondary | ICD-10-CM | POA: Diagnosis not present

## 2018-02-01 DIAGNOSIS — I719 Aortic aneurysm of unspecified site, without rupture: Secondary | ICD-10-CM | POA: Diagnosis not present

## 2018-02-01 DIAGNOSIS — Z87891 Personal history of nicotine dependence: Secondary | ICD-10-CM | POA: Insufficient documentation

## 2018-02-01 DIAGNOSIS — Z9114 Patient's other noncompliance with medication regimen: Secondary | ICD-10-CM | POA: Insufficient documentation

## 2018-02-01 DIAGNOSIS — I10 Essential (primary) hypertension: Secondary | ICD-10-CM

## 2018-02-01 DIAGNOSIS — Q254 Congenital malformation of aorta unspecified: Secondary | ICD-10-CM

## 2018-02-01 DIAGNOSIS — I7100 Dissection of unspecified site of aorta: Secondary | ICD-10-CM | POA: Diagnosis not present

## 2018-02-01 NOTE — Progress Notes (Signed)
Subjective:    Patient ID: Jordan Mcintosh, male    DOB: 1956-05-10, 62 y.o.   MRN: [id number]  HPI 62 y.o. male with h/o of HTN, polysubstance abuse, medical noncompliance (no meds X 9 months) presents for follow up for left posterior corona radiates, lentiform, and external capsule infarcts.   Last clinic visit 08/11/16.  Since that time, pt states he is not doing exercises. He was released from Neurology. BP is controlled.    Pain Inventory Average Pain 0 Pain Right Now 0 My pain is  no pain  In the last 24 hours, has pain interfered with the following? General activity 0 Relation with others 0 Enjoyment of life 0 What TIME of day is your pain at its worst?  no pain Sleep (in general) Fair  Pain is worse with:  no pain Pain improves with:  no pain Relief from Meds:  no pain  Mobility walk without assistance how many minutes can you walk? unlimited ability to climb steps?  yes do you drive?  yes Do you have any goals in this area?  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  no  Neuro/Psych No problems in this area  Prior Studies Any changes since last visit?  no  Physicians involved in your care Any changes since last visit?  no   Family History  Problem Relation Age of Onset  . Stroke Mother   . Stroke Sister   . Stroke Daughter    Social History   Socioeconomic History  . Marital status: Married    Spouse name: Not on file  . Number of children: Not on file  . Years of education: Not on file  . Highest education level: Not on file  Occupational History  . Not on file  Social Needs  . Financial resource strain: Not on file  . Food insecurity:    Worry: Not on file    Inability: Not on file  . Transportation needs:    Medical: Not on file    Non-medical: Not on file  Tobacco Use  . Smoking status: Former Games developermoker  . Smokeless tobacco: Never Used  . Tobacco comment: Smoked  marijauna in the past but quit 9 years ago    Substance and Sexual Activity  . Alcohol use: No  . Drug use: Yes    Types: Marijuana    Comment: Smoked marijauna in the past but quit 9 years ago; extensive gasoline sniffer as a teenager  . Sexual activity: Yes  Lifestyle  . Physical activity:    Days per week: Not on file    Minutes per session: Not on file  . Stress: Not on file  Relationships  . Social connections:    Talks on phone: Not on file    Gets together: Not on file    Attends religious service: Not on file    Active member of club or organization: Not on file    Attends meetings of clubs or organizations: Not on file    Relationship status: Not on file  Other Topics Concern  . Not on file  Social History Narrative  . Not on file   Past Surgical History:  Procedure Laterality Date  . INGUINAL HERNIA REPAIR    . UMBILICAL HERNIA REPAIR     Past Medical History:  Diagnosis Date  . Hypertension   . Hypertension    BP 115/74  Pulse 65   Resp 14   Ht 5\' 10"  (1.778 m)   Wt 213 lb (96.6 kg)   SpO2 93%   BMI 30.56 kg/m   Opioid Risk Score:   Fall Risk Score:  `1  Depression screen PHQ 2/9  Depression screen PHQ 2/9 12/10/2016  Decreased Interest 3  Down, Depressed, Hopeless 2  PHQ - 2 Score 5  Altered sleeping 1  Tired, decreased energy 3  Change in appetite 0  Feeling bad or failure about yourself  0  Trouble concentrating 2  Moving slowly or fidgety/restless 2  Suicidal thoughts 0  PHQ-9 Score 13    Review of Systems  Constitutional: Positive for unexpected weight change.  HENT: Negative.   Eyes: Negative.   Respiratory: Negative.   Cardiovascular: Negative.   Gastrointestinal: Negative.   Endocrine: Negative.   Genitourinary: Negative.   Musculoskeletal: Negative.   Skin: Negative.   Allergic/Immunologic: Negative.   Neurological: Negative.   Hematological: Negative.   Psychiatric/Behavioral: Negative.   All other systems reviewed and  are negative.     Objective:   Physical Exam Constitutional: He appears well-developed and well-nourished.  HENT: Normocephalic and atraumatic.  Eyes: EOMI. No discharge.  Cardiovascular: RRR.  No JVD. Respiratory: Effort normal and breath sounds normal.  GI: Soft. Bowel sounds are normal.  Musculoskeletal: He exhibits no edema or tenderness.  Neurological: He is alert and oriented.  Right facial weakness with mild dysarthria Able to follow commands.  Motor: RUE 5/5 proximal to distal RLE: 5/5 proximal to distal  LUE/LLE: 5/5 proximal to distal  No increase in tone appreciated Skin: Skin is warm and dry.  Psychiatric: He has a normal mood and affect.     Assessment & Plan:  62 y.o. male with h/o of HTN, polysubstance abuse, medical noncompliance (no meds X 9 months) presents for follow up for left posterior corona radiates, lentiform, and external capsule infarcts.   1. Left posterior corona radiata, lentiform and ext capsule infarcts  HEP, encouraged compliance again  Cont meds  Needs to follow up with Neurology   2. Intramural aortic Hematoma and penetrating ulcer: BP goals < 140.  Cont follow up with CTS  3. HTN  Follow up with PCP  Cont meds  Controlled today

## 2018-04-12 ENCOUNTER — Other Ambulatory Visit: Payer: Self-pay | Admitting: Cardiothoracic Surgery

## 2018-04-12 DIAGNOSIS — I712 Thoracic aortic aneurysm, without rupture, unspecified: Secondary | ICD-10-CM

## 2018-05-24 ENCOUNTER — Ambulatory Visit: Payer: BLUE CROSS/BLUE SHIELD | Admitting: Cardiothoracic Surgery

## 2018-05-24 ENCOUNTER — Other Ambulatory Visit: Payer: BLUE CROSS/BLUE SHIELD

## 2019-02-01 ENCOUNTER — Encounter
Payer: BC Managed Care – PPO | Attending: Physical Medicine & Rehabilitation | Admitting: Physical Medicine & Rehabilitation

## 2019-12-14 IMAGING — CT CT ANGIO CHEST
2 of 5 series · 9 of 30 positions shown · IV contrast (75CC ISOVUE 370)
Comparison: CT 02/08/2017

CLINICAL DATA: PIZANO/WELVIS NAGAI, SOB STROKE JUMPER/ANNJA HOJI [DATE] NO HX CA, DM HX
HTN

LABS DRAWN 301  CR  1.3       GFR   59
EXAM:
CT ANGIOGRAPHY CHEST WITH CONTRAST
TECHNIQUE: Multidetector CT imaging of the chest was performed using the
standard protocol during bolus administration of intravenous
contrast. Multiplanar CT image reconstructions and MIPs were
obtained to evaluate the vascular anatomy.
CONTRAST:  75mL FS3JWR-EQN IOPAMIDOL (FS3JWR-EQN) INJECTION 76%

[Series 4: angio · axial · 0.82mm/px · z∈[-242,-32]mm · 4 of 141 slices shown]
[im 29/141  lung]
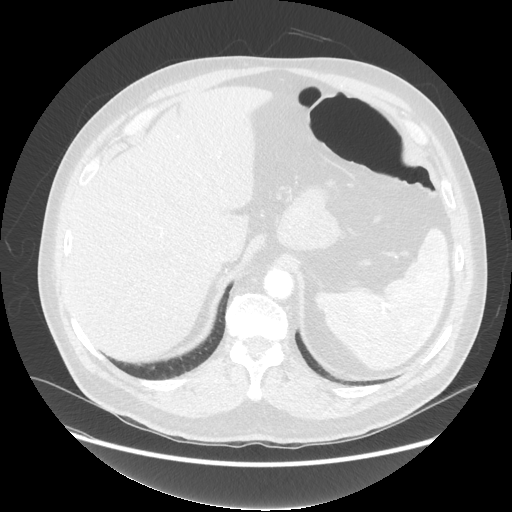
[im 57/141  mediastinal]
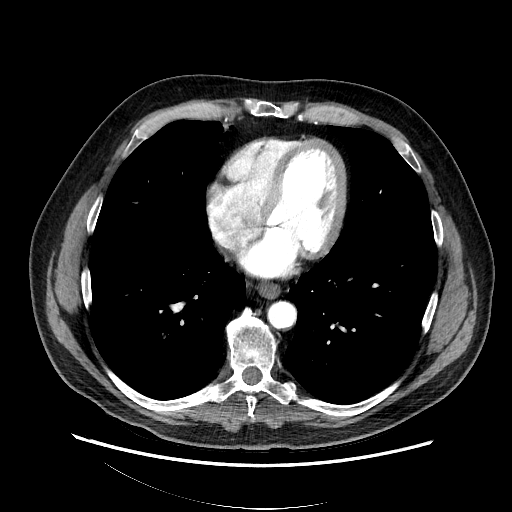
[im 85/141  lung]
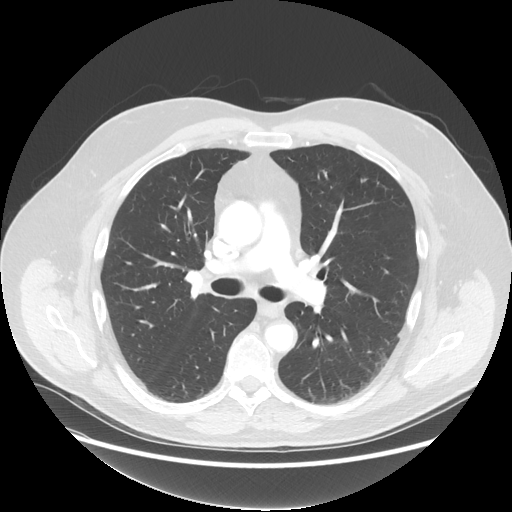
[im 113/141  mediastinal]
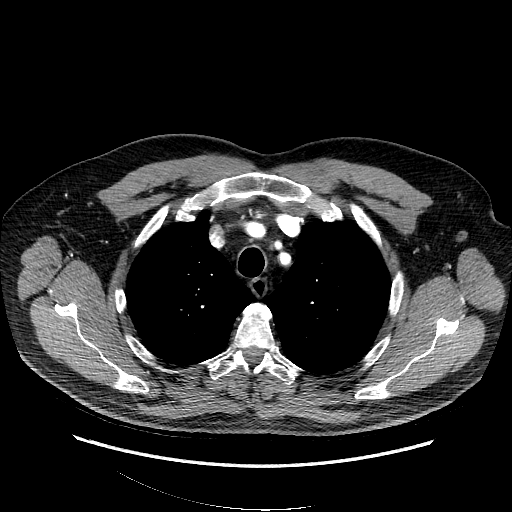

[Series 602: sagittal body · sagittal · 0.82mm/px · 5 of 168 slices shown]
[im 28/168  lung]
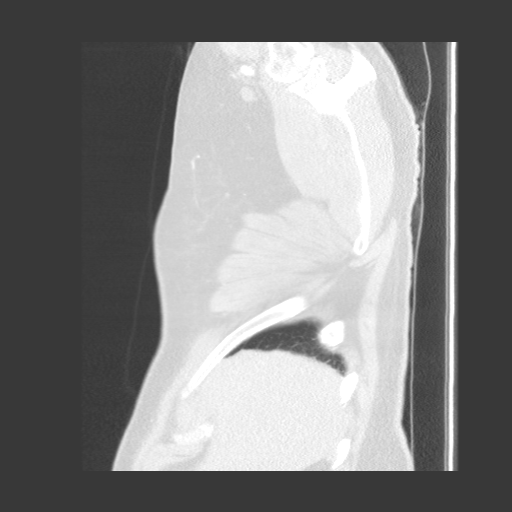
[im 56/168  lung]
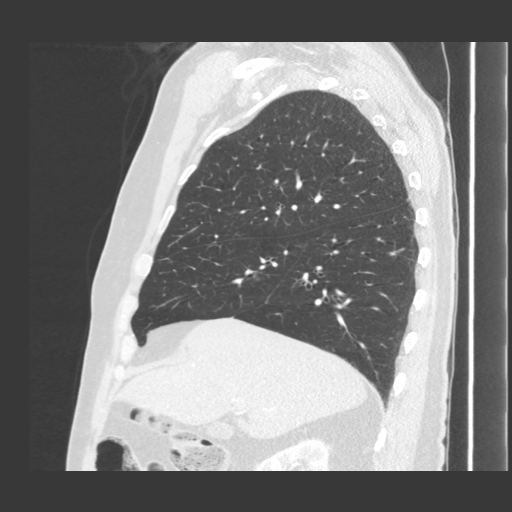
[im 84/168  lung]
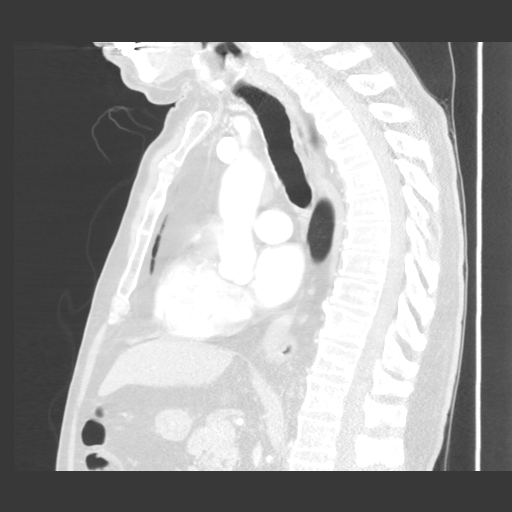
[im 112/168  lung]
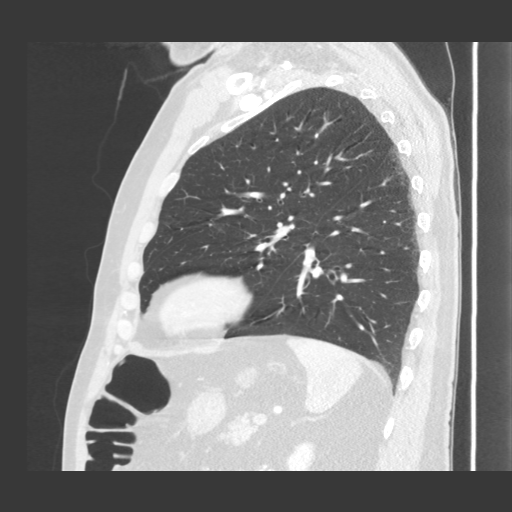
[im 140/168  lung]
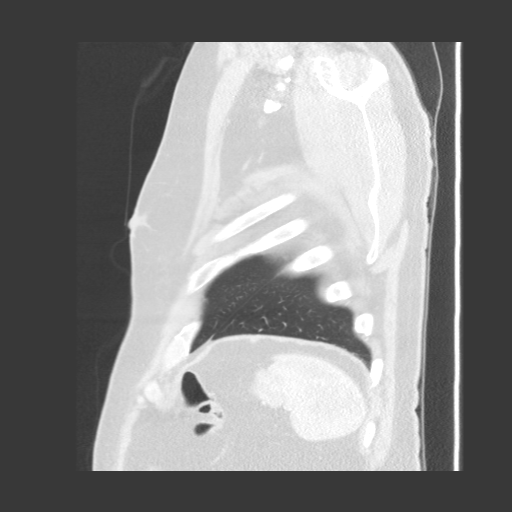

[9 of 30 positions shown; findings below may reference images not displayed]

FINDINGS: Cardiovascular: Small penetrating ulcer along the inferolateral
border of the transverse thoracic aorta is decreased slightly in
volume measuring 10 by 8 mm (image 45, series 4) compared with 12 x
8 mm on CT 02/08/2017.

The ascending aorta is normal with maximum transverse diameter 33
mm. Great vessels are normal mild intimal calcification origins.
Descending thoracic aorta is normal with mild intimal calcification
proximal abdominal aorta is normal. There is intimal calcification
origins of the celiac trunk and SMA bilateral single renal arteries
noted

Mediastinum/Nodes: No axillary supraclavicular adenopathy. No
mediastinal hilar adenopathy. No pericardial fluid esophagus normal

Lungs/Pleura: Lungs are clear.  Airways normal

Upper Abdomen: Limited view of the liver, kidneys, pancreas are
unremarkable. Normal adrenal glands.

Musculoskeletal: No aggressive osseous lesion.

Review of the MIP images confirms the above findings.
IMPRESSION: 1. Slight decrease in volume of penetrating ulcer of the transverse
thoracic aorta at the level of the isthmus.
2. Coronary artery calcification and Aortic Atherosclerosis
([id number]).
